# Patient Record
Sex: Female | Born: 1950
Health system: Southern US, Community
[De-identification: ages and names within clinical notes are randomized; demographics above are authoritative.]

## PROBLEM LIST (undated history)

## (undated) ENCOUNTER — Emergency Department (HOSPITAL_BASED_OUTPATIENT_CLINIC_OR_DEPARTMENT_OTHER): Admission: EM | Payer: Medicare HMO

## (undated) DIAGNOSIS — I10 Essential (primary) hypertension: Secondary | ICD-10-CM

## (undated) DIAGNOSIS — R42 Dizziness and giddiness: Secondary | ICD-10-CM

## (undated) DIAGNOSIS — IMO0002 Reserved for concepts with insufficient information to code with codable children: Secondary | ICD-10-CM

## (undated) DIAGNOSIS — K589 Irritable bowel syndrome without diarrhea: Secondary | ICD-10-CM

## (undated) DIAGNOSIS — Z780 Asymptomatic menopausal state: Secondary | ICD-10-CM

## (undated) HISTORY — DX: Essential (primary) hypertension: I10

## (undated) HISTORY — DX: Irritable bowel syndrome, unspecified: K58.9

## (undated) HISTORY — DX: Dizziness and giddiness: R42

## (undated) HISTORY — DX: Asymptomatic menopausal state: Z78.0

## (undated) HISTORY — DX: Reserved for concepts with insufficient information to code with codable children: IMO0002

---

## 1998-06-08 ENCOUNTER — Encounter: Payer: Self-pay | Admitting: *Deleted

## 1998-06-08 ENCOUNTER — Ambulatory Visit (HOSPITAL_COMMUNITY): Admission: RE | Admit: 1998-06-08 | Discharge: 1998-06-08 | Payer: Self-pay | Admitting: *Deleted

## 1998-06-10 ENCOUNTER — Emergency Department (HOSPITAL_COMMUNITY): Admission: EM | Admit: 1998-06-10 | Discharge: 1998-06-10 | Payer: Self-pay | Admitting: Emergency Medicine

## 1999-01-21 ENCOUNTER — Encounter: Payer: Self-pay | Admitting: *Deleted

## 1999-01-21 ENCOUNTER — Ambulatory Visit (HOSPITAL_COMMUNITY): Admission: RE | Admit: 1999-01-21 | Discharge: 1999-01-21 | Payer: Self-pay | Admitting: *Deleted

## 1999-07-14 ENCOUNTER — Ambulatory Visit (HOSPITAL_COMMUNITY): Admission: RE | Admit: 1999-07-14 | Discharge: 1999-07-14 | Payer: Self-pay | Admitting: *Deleted

## 1999-07-14 ENCOUNTER — Encounter: Payer: Self-pay | Admitting: *Deleted

## 2000-08-01 ENCOUNTER — Encounter: Payer: Self-pay | Admitting: *Deleted

## 2000-08-01 ENCOUNTER — Ambulatory Visit (HOSPITAL_COMMUNITY): Admission: RE | Admit: 2000-08-01 | Discharge: 2000-08-01 | Payer: Self-pay | Admitting: *Deleted

## 2000-11-15 ENCOUNTER — Ambulatory Visit (HOSPITAL_COMMUNITY): Admission: RE | Admit: 2000-11-15 | Discharge: 2000-11-15 | Payer: Self-pay | Admitting: Surgery

## 2002-08-11 ENCOUNTER — Other Ambulatory Visit: Admission: RE | Admit: 2002-08-11 | Discharge: 2002-08-11 | Payer: Self-pay | Admitting: Obstetrics and Gynecology

## 2002-09-17 ENCOUNTER — Ambulatory Visit (HOSPITAL_COMMUNITY): Admission: RE | Admit: 2002-09-17 | Discharge: 2002-09-17 | Payer: Self-pay | Admitting: Obstetrics and Gynecology

## 2002-09-17 HISTORY — PX: LEEP: SHX91

## 2003-03-02 ENCOUNTER — Other Ambulatory Visit: Admission: RE | Admit: 2003-03-02 | Discharge: 2003-03-02 | Payer: Self-pay | Admitting: Obstetrics and Gynecology

## 2003-06-22 ENCOUNTER — Other Ambulatory Visit: Admission: RE | Admit: 2003-06-22 | Discharge: 2003-06-22 | Payer: Self-pay | Admitting: Obstetrics and Gynecology

## 2003-12-02 ENCOUNTER — Other Ambulatory Visit: Admission: RE | Admit: 2003-12-02 | Discharge: 2003-12-02 | Payer: Self-pay | Admitting: Obstetrics and Gynecology

## 2003-12-30 ENCOUNTER — Ambulatory Visit: Payer: Self-pay | Admitting: Gastroenterology

## 2004-01-01 ENCOUNTER — Ambulatory Visit: Payer: Self-pay | Admitting: Gastroenterology

## 2004-01-20 ENCOUNTER — Ambulatory Visit: Payer: Self-pay | Admitting: Gastroenterology

## 2004-04-05 ENCOUNTER — Ambulatory Visit: Payer: Self-pay | Admitting: Gastroenterology

## 2004-09-08 DIAGNOSIS — R87619 Unspecified abnormal cytological findings in specimens from cervix uteri: Secondary | ICD-10-CM

## 2004-09-08 DIAGNOSIS — IMO0002 Reserved for concepts with insufficient information to code with codable children: Secondary | ICD-10-CM

## 2004-09-08 HISTORY — DX: Unspecified abnormal cytological findings in specimens from cervix uteri: R87.619

## 2004-09-08 HISTORY — DX: Reserved for concepts with insufficient information to code with codable children: IMO0002

## 2004-09-26 ENCOUNTER — Other Ambulatory Visit: Admission: RE | Admit: 2004-09-26 | Discharge: 2004-09-26 | Payer: Self-pay | Admitting: Obstetrics and Gynecology

## 2005-04-12 ENCOUNTER — Other Ambulatory Visit: Admission: RE | Admit: 2005-04-12 | Discharge: 2005-04-12 | Payer: Self-pay | Admitting: Obstetrics and Gynecology

## 2006-04-08 ENCOUNTER — Emergency Department (HOSPITAL_COMMUNITY): Admission: EM | Admit: 2006-04-08 | Discharge: 2006-04-08 | Payer: Self-pay | Admitting: Emergency Medicine

## 2006-05-07 ENCOUNTER — Encounter: Admission: RE | Admit: 2006-05-07 | Discharge: 2006-05-07 | Payer: Self-pay | Admitting: Obstetrics and Gynecology

## 2010-03-20 ENCOUNTER — Encounter: Payer: Self-pay | Admitting: Obstetrics and Gynecology

## 2010-04-13 ENCOUNTER — Other Ambulatory Visit: Payer: Self-pay | Admitting: Gastroenterology

## 2010-04-15 ENCOUNTER — Ambulatory Visit
Admission: RE | Admit: 2010-04-15 | Discharge: 2010-04-15 | Disposition: A | Discharge status: Home / Self Care | Payer: BC Managed Care – PPO | Source: Ambulatory Visit | Attending: Gastroenterology | Admitting: Gastroenterology

## 2010-04-21 ENCOUNTER — Other Ambulatory Visit: Payer: Self-pay | Admitting: Gastroenterology

## 2010-04-21 ENCOUNTER — Other Ambulatory Visit: Payer: BC Managed Care – PPO

## 2010-04-21 ENCOUNTER — Ambulatory Visit
Admission: RE | Admit: 2010-04-21 | Discharge: 2010-04-21 | Disposition: A | Discharge status: Home / Self Care | Payer: BC Managed Care – PPO | Source: Ambulatory Visit | Attending: Gastroenterology | Admitting: Gastroenterology

## 2010-04-21 ENCOUNTER — Ambulatory Visit: Admission: RE | Admit: 2010-04-21 | Payer: BC Managed Care – PPO | Source: Ambulatory Visit

## 2010-04-21 DIAGNOSIS — R1011 Right upper quadrant pain: Secondary | ICD-10-CM

## 2010-04-21 MED ORDER — IOHEXOL 300 MG/ML  SOLN
100.0000 mL | Freq: Once | INTRAMUSCULAR | Status: AC | PRN
  Administered 2010-04-21: 100 mL via INTRAVENOUS

## 2011-03-17 ENCOUNTER — Ambulatory Visit (INDEPENDENT_AMBULATORY_CARE_PROVIDER_SITE_OTHER): Payer: BC Managed Care – PPO

## 2011-03-17 DIAGNOSIS — B9789 Other viral agents as the cause of diseases classified elsewhere: Secondary | ICD-10-CM

## 2011-05-08 ENCOUNTER — Ambulatory Visit (INDEPENDENT_AMBULATORY_CARE_PROVIDER_SITE_OTHER): Payer: BC Managed Care – PPO | Admitting: Physician Assistant

## 2011-05-08 VITALS — BP 116/69 | HR 75 | Temp 98.5°F | Resp 16 | Ht 67.5 in | Wt 214.0 lb

## 2011-05-08 DIAGNOSIS — H6690 Otitis media, unspecified, unspecified ear: Secondary | ICD-10-CM

## 2011-05-08 DIAGNOSIS — R059 Cough, unspecified: Secondary | ICD-10-CM

## 2011-05-08 DIAGNOSIS — R05 Cough: Secondary | ICD-10-CM

## 2011-05-08 DIAGNOSIS — I10 Essential (primary) hypertension: Secondary | ICD-10-CM | POA: Insufficient documentation

## 2011-05-08 DIAGNOSIS — J309 Allergic rhinitis, unspecified: Secondary | ICD-10-CM | POA: Insufficient documentation

## 2011-05-08 DIAGNOSIS — R42 Dizziness and giddiness: Secondary | ICD-10-CM

## 2011-05-08 MED ORDER — PROMETHAZINE-DM 6.25-15 MG/5ML PO SYRP: 5.0000 mL | ORAL_SOLUTION | Freq: Four times a day (QID) | ORAL | Status: AC | PRN

## 2011-05-08 MED ORDER — FLUTICASONE PROPIONATE 50 MCG/ACT NA SUSP: 2.0000 | Freq: Every day | NASAL | Status: DC

## 2011-05-08 MED ORDER — MECLIZINE HCL 25 MG PO TABS: 25.0000 mg | ORAL_TABLET | Freq: Three times a day (TID) | ORAL | Status: AC | PRN

## 2011-05-08 MED ORDER — CEFDINIR 300 MG PO CAPS: 300.0000 mg | ORAL_CAPSULE | Freq: Two times a day (BID) | ORAL | Status: AC

## 2011-05-08 NOTE — Progress Notes (Signed)
Patient ID: Alejandra Hebert MRN: [id number], DOB: 1950-03-02, 61 y.o. Date of Encounter: 05/08/2011, 7:23 PM  Primary Physician: No primary provider on file.  Chief Complaint:  Chief Complaint  Patient presents with  . URI    Started last Monday  . Otalgia    Right ear; Making pt dizzy    HPI: 61 y.o. year old female presents with 8 day history of nasal congestion, post nasal drip, sore throat, sinus pressure, and cough. Afebrile. No chills. Nasal congestion thick and clear. Cough is non-productive and not associated with time of day. Ears feel full, leading to sensation of muffled hearing, right greater than left. She states she has dealt with this problem her entire life. Most times when she gets a URI she develpos vertigo and "the room spins." Requests refill of Antivert.  Has tried OTC cold preps without success. No GI complaints. Appetite normal. Several sick contacts at work. No recent antibiotics, or recent travels.   She also has history moderate allergic rhinitis. Has previously used nasal sprays with good results, she just ran out and never got them refilled.   No leg trauma, sedentary periods, h/o cancer, or tobacco use.  Past Medical History  Diagnosis Date  . Vertigo   . Allergic rhinitis   . HTN (hypertension)      Home Meds: Prior to Admission medications   Medication Sig Start Date End Date Taking? Authorizing Provider  losartan (COZAAR) 25 MG tablet Take 25 mg by mouth daily.   Yes Historical Provider, MD    Allergies:  Allergies  Allergen Reactions  . Other Nausea Only    Etodalac  . Sulfa Antibiotics     History   Social History  . Marital Status: Divorced    Spouse Name: N/A    Number of Children: N/A  . Years of Education: N/A   Occupational History  . Not on file.   Social History Main Topics  . Smoking status: Former Smoker    Quit date: 03/31/2011  . Smokeless tobacco: Not on file  . Alcohol Use: Not on file  . Drug Use: Not on  file  . Sexually Active: Not on file   Other Topics Concern  . Not on file   Social History Narrative  . No narrative on file     Review of Systems: Constitutional: negative for chills, fever, night sweats or weight changes Cardiovascular: negative for chest pain or palpitations Respiratory: negative for hemoptysis, wheezing, or shortness of breath Abdominal: negative for abdominal pain, nausea, vomiting or diarrhea Dermatological: negative for rash Neurologic: negative for headache or dizziness   Physical Exam: Blood pressure 116/69, pulse 75, temperature 98.5 F (36.9 C), resp. rate 16, height 5' 7.5" (1.715 m), weight 214 lb (97.07 kg)., Body mass index is 33.02 kg/(m^2). General: Well developed, well nourished, in no acute distress. Head: Normocephalic, atraumatic, eyes without discharge, sclera non-icteric, nares are congested. Bilateral auditory canals clear, Right TM erythematous, dull, bulging, and with purulent effusion behind. No perforation. LeftTM is without perforation, pearly grey with reflective cone of light. No sinus TTP. Oral cavity moist, dentition normal. Posterior pharynx with post nasal drip and mild erythema. No peritonsillar abscess or tonsillar exudate. Neck: Supple. No thyromegaly. Full ROM. No lymphadenopathy. Lungs: Clear bilaterally to auscultation without wheezes, rales, or rhonchi. Breathing is unlabored.  Heart: RRR with S1 S2. No murmurs, rubs, or gallops appreciated. Msk:  Strength and tone normal for age. Extremities: No clubbing or cyanosis. No edema.  Neuro: Alert and oriented X 3. Moves all extremities spontaneously. CNII-XII grossly in tact. Psych:  Responds to questions appropriately with a normal affect.     ASSESSMENT AND PLAN:  61 y.o. year old female with AOM, vertigo, and allergic rhinitis with cough 1. AOM -Omnicef 300 mg 1 po bid #20 no RF  -Flonase 2 sprays each nare daily #1 6 RF -Mucinex -Tylenol/Motrin prn -Rest/fluids -RTC  precautions -RTC 3-5 days if no improvement  2. Vertigo -Antivert 25 mg #30 1 po tid prn vertigo RF 6 -Flonase per above  3. Allergic rhinitis with cough -Phenergan DM #4oz 1 tsp po q 4-6 hours prn cough no RF -Flonase per above  Signed, Eula Listen, PA-C 05/08/2011 7:23 PM

## 2011-05-20 ENCOUNTER — Ambulatory Visit (INDEPENDENT_AMBULATORY_CARE_PROVIDER_SITE_OTHER): Payer: BC Managed Care – PPO | Admitting: Family Medicine

## 2011-05-20 VITALS — BP 123/79 | HR 66 | Temp 98.5°F | Resp 16 | Ht 66.25 in | Wt 215.0 lb

## 2011-05-20 DIAGNOSIS — M62838 Other muscle spasm: Secondary | ICD-10-CM

## 2011-05-20 DIAGNOSIS — M549 Dorsalgia, unspecified: Secondary | ICD-10-CM

## 2011-05-20 NOTE — Progress Notes (Signed)
[facility]:  Office Visit  Chief Complaint:  Chief Complaint  Patient presents with  . Back Pain    mid-back  . Gas    HPI: Alejandra Hebert is a 61 y.o. female who complains of  Intermittent mid back pain x 2 weeks, sharp burning pain. Has not tried anything for it. This is actually a chronic problem, Tension/stress causes msk to tense and then gets worse. Has had xrays at chiropractors showing DJD. 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 She has IBS and cannot take a lot of things.   Past Medical History  Diagnosis Date  . Vertigo   . Allergic rhinitis   . HTN (hypertension)   . Irritable bowel syndrome    History reviewed. No pertinent past surgical history. History   Social History  . Marital Status: Divorced    Spouse Name: N/A    Number of Children: N/A  . Years of Education: N/A   Social History Main Topics  . Smoking status: Former Smoker    Quit date: 03/31/2011  . Smokeless tobacco: None  . Alcohol Use: None  . Drug Use: None  . Sexually Active: None   Other Topics Concern  . None   Social History Narrative  . None   No family history on file. Allergies  Allergen Reactions  . Other Nausea Only    Etodalac  . Sulfa Antibiotics    Prior to Admission medications   Medication Sig Start Date End Date Taking? Authorizing Provider  losartan (COZAAR) 25 MG tablet Take 25 mg by mouth daily.   Yes Historical Provider, MD  fluticasone (FLONASE) 50 MCG/ACT nasal spray Place 2 sprays into the nose daily. 05/08/11 05/07/12  Raymon Mutton Dunn, PA-C     ROS: The patient denies fevers, chills, night sweats, unintentional weight loss, chest pain, palpitations, wheezing, dyspnea on exertion, nausea, vomiting, abdominal pain, dysuria, hematuria, melena, numbness, weakness, or tingling. + back pain  All other systems have been reviewed and were otherwise  negative with the exception of those mentioned in the HPI and as above.    PHYSICAL EXAM: Filed Vitals:   05/20/11 1639  BP: 123/79  Pulse: 66  Temp: 98.5 F (36.9 C)  Resp: 16   Filed Vitals:   05/20/11 1639  Height: 5' 6.25" (1.683 m)  Weight: 215 lb (97.523 kg)   Body mass index is 34.44 kg/(m^2).  General: Alert, no acute distress HEENT:  Normocephalic, atraumatic, oropharynx patent.  Cardiovascular:  Regular rate and rhythm, no rubs murmurs or gallops.  No Carotid bruits, radial pulse intact. No pedal edema.  Respiratory: Clear to auscultation bilaterally.  No wheezes, rales, or rhonchi.  No cyanosis, no use of accessory musculature GI: No organomegaly, abdomen is soft and non-tender, positive bowel sounds.  No masses. Skin: No rashes. Neurologic: Facial musculature symmetric. Psychiatric: Patient is appropriate throughout our interaction. Lymphatic: No cervical lymphadenopathy Musculoskeletal: Gait intact. Neck: nl ROM, no paraspinal msk tenderness Thoracic: T paraspinal  msk tenderness,  no warmth/erythema, no scapular winging, full ROM. Patient feels better when she opens her chest up and moves her rhomboids and brings her shoulders back   LABS: No results found for this or any previous visit.   EKG/XRAY:   Primary read interpreted by Dr[facility] Conley Roll[facility] at [facility].   ASSESSMENT/PLAN: Encounter Diagnoses  Name  Primary?  . Muscle spasm Yes  . Back pain     Since she has IBS and a "sensitive stomach" I will rx lidocaine patch and see how she does. Advise to do back strengthening exercises and proper ergonomics.  If no improvement then consider xray F/u in 1 month   Buel Molder PHUONG, DO 05/22/2011 4:26 PM

## 2011-05-21 ENCOUNTER — Telehealth: Payer: Self-pay | Admitting: *Deleted

## 2011-05-21 MED ORDER — LIDOCAINE 5 % EX PTCH: 6.0000 | MEDICATED_PATCH | CUTANEOUS | Status: AC

## 2011-05-21 NOTE — Telephone Encounter (Signed)
PER DR LE PT IS TO USE 1/2 TO 1 PATCH ON NECK.  #30.  SPOKE WITH PHARMACIST ANGELA LMOM ADVISING PT TO PICKUP RX.

## 2011-06-06 ENCOUNTER — Emergency Department (HOSPITAL_COMMUNITY)
Admission: EM | Admit: 2011-06-06 | Discharge: 2011-06-06 | Discharge status: Against Medical Advice | Payer: BC Managed Care – PPO

## 2011-06-07 ENCOUNTER — Ambulatory Visit: Payer: Self-pay | Admitting: Family Medicine

## 2011-06-07 VITALS — BP 136/82 | HR 75 | Temp 98.0°F | Resp 18 | Ht 69.0 in | Wt 213.2 lb

## 2011-06-07 DIAGNOSIS — I10 Essential (primary) hypertension: Secondary | ICD-10-CM

## 2011-06-07 DIAGNOSIS — M79609 Pain in unspecified limb: Secondary | ICD-10-CM

## 2011-06-07 DIAGNOSIS — M79672 Pain in left foot: Secondary | ICD-10-CM

## 2011-06-07 DIAGNOSIS — S91115A Laceration without foreign body of left lesser toe(s) without damage to nail, initial encounter: Secondary | ICD-10-CM

## 2011-06-07 DIAGNOSIS — S91309A Unspecified open wound, unspecified foot, initial encounter: Secondary | ICD-10-CM

## 2011-06-07 NOTE — Progress Notes (Signed)
  Subjective:    Patient ID: Alejandra Hebert, female    DOB: 05-04-1950, 61 y.o.   MRN: [id number]  Toe Pain    Patient presents after tripping over garden fence about  Injured (L) foot; bruised first toe and cut plantar surface of (L) foot  Last Tdap > 10 years  PMH/ HTN   Review of Systems     Objective:   Physical Exam  Constitutional: She appears well-developed.  Cardiovascular: Normal rate, regular rhythm and normal heart sounds.   Pulmonary/Chest: Effort normal and breath sounds normal.  Musculoskeletal:       Feet:  Skin: Skin is warm. Ecchymosis (L) first toe and laceration (.5 cm superficial laceration plantar surface of (L) fifth toe ) noted.          Assessment & Plan:   1. Foot pain, left   2. Laceration of fifth toe, left; superficial   3. HTN (hypertension)    Wound cleansed and strips strips applied Patient declined x ray of (L) first toe Patient to receive Tdap at [facility] as currently she is without health insurance Reviewed S/S of secondary infection RTC if symptoms persist or worsen

## 2011-08-03 ENCOUNTER — Ambulatory Visit (INDEPENDENT_AMBULATORY_CARE_PROVIDER_SITE_OTHER): Payer: 59 | Admitting: Family Medicine

## 2011-08-03 ENCOUNTER — Encounter: Payer: Self-pay | Admitting: Family Medicine

## 2011-08-03 VITALS — BP 128/74 | HR 69 | Temp 98.1°F | Resp 16 | Ht 66.5 in | Wt 220.2 lb

## 2011-08-03 DIAGNOSIS — H6120 Impacted cerumen, unspecified ear: Secondary | ICD-10-CM

## 2011-08-03 DIAGNOSIS — H6090 Unspecified otitis externa, unspecified ear: Secondary | ICD-10-CM

## 2011-08-03 DIAGNOSIS — H60399 Other infective otitis externa, unspecified ear: Secondary | ICD-10-CM

## 2011-08-03 MED ORDER — NEOMYCIN-POLYMYXIN-HC 3.5-10000-1 OT SOLN: 3.0000 [drp] | Freq: Four times a day (QID) | OTIC | Status: AC

## 2011-08-03 NOTE — Progress Notes (Signed)
  Subjective:    Patient ID: Alejandra Hebert, female    DOB: [birth year], 61 y.o.   MRN: [id number]  HPI Cerumen impaction.  This has been a recurrent issue.  Has had some difficulty in hearing associated with this.  No headaches, dizziness.  No fevers.    Review of Systems See HPI, otherwise ROS negative     Objective:   Physical Exam Gen: up in chair, NAD HEENT: NCAT, EOMI, bilateral cerumen impaction, L tenderness to otoscopic evaluation  CV: RRR, no murmurs auscultated PULM: CTAB, no wheezes, rales, rhoncii ABD: S/NT/+ bowel sounds  EXT: 2+ peripheral pulses    Assessment & Plan:  Cerumen Impaction:  Manually removed at bedside.  Discussed general ear cleaning guidelines  Handout given.  Follow up as needed.   L ear otitis externa- cortisporin otic.     The patient and/or caregiver has been counseled thoroughly with regard to treatment plan and/or medications prescribed including dosage, schedule, interactions, rationale for use, and possible side effects and they verbalize understanding. Diagnoses and expected course of recovery discussed and will return if not improved as expected or if the condition worsens. Patient and/or caregiver verbalized understanding.

## 2011-08-03 NOTE — Patient Instructions (Signed)
Cerumen Impaction  A cerumen impaction is when the wax in your ear forms a plug. This plug usually causes reduced hearing. Sometimes it also causes an earache or dizziness. Removing a cerumen impaction can be difficult and painful. The wax sticks to the ear canal. The canal is sensitive and bleeds easily. If you try to remove a heavy wax buildup with a cotton tipped swab, you may push it in further.  Irrigation with water, suction, and small ear curettes may be used to clear out the wax. If the impaction is fixed to the skin in the ear canal, ear drops may be needed for a few days to loosen the wax. People who build up a lot of wax frequently can use ear wax removal products available in your local drugstore.  SEEK MEDICAL CARE IF:   You develop an earache, increased hearing loss, or marked dizziness.  Document Released: 03/23/2004 Document Revised: 02/02/2011 Document Reviewed: 05/13/2009  [employer] Patient Information 2012 [employer].    Otitis Externa  Otitis externa ("swimmer's ear") is a germ (bacterial) or fungal infection of the outer ear canal (from the eardrum to the outside of the ear). Swimming in dirty water may cause swimmer's ear. It also may be caused by moisture in the ear from water remaining after swimming or bathing. Often the first signs of infection may be itching in the ear canal. This may progress to ear canal swelling, redness, and pus drainage, which may be signs of infection.  HOME CARE INSTRUCTIONS    Apply the antibiotic drops to the ear canal as prescribed by your doctor.   This can be a very painful medical condition. A strong pain reliever may be prescribed.   Only take over-the-counter or prescription medicines for pain, discomfort, or fever as directed by your caregiver.   If your caregiver has given you a follow-up appointment, it is very important to keep that appointment. Not keeping the appointment could result in a chronic or permanent injury, pain, hearing loss and  disability. If there is any problem keeping the appointment, you must call back to this facility for assistance.  PREVENTION    It is important to keep your ear dry. Use the corner of a towel to wick water out of the ear canal after swimming or bathing.   Avoid scratching in your ear. This can damage the ear canal or remove the protective wax lining the canal and make it easier for germs (bacteria) or a fungus to grow.   You may use ear drops made of rubbing alcohol and vinegar after swimming to prevent future "swimmer's ear" infections. Make up a small bottle of equal parts white vinegar and alcohol. Put 3 or 4 drops into each ear after swimming.   Avoid swimming in lakes, polluted water, or poorly chlorinated pools.  SEEK MEDICAL CARE IF:    An oral temperature above 102 F (38.9 C) develops.   Your ear is still painful after 3 days and shows signs of getting worse (redness, swelling, pain, or pus).  MAKE SURE YOU:    Understand these instructions.   Will watch your condition.   Will get help right away if you are not doing well or get worse.  Document Released: 02/13/2005 Document Revised: 02/02/2011 Document Reviewed: 09/20/2007  [employer] Patient Information 2012 [employer]

## 2011-08-05 NOTE — Progress Notes (Signed)
Note reviewed and agree with plan

## 2011-09-18 ENCOUNTER — Encounter: Payer: Self-pay | Admitting: Obstetrics and Gynecology

## 2011-09-21 ENCOUNTER — Ambulatory Visit (INDEPENDENT_AMBULATORY_CARE_PROVIDER_SITE_OTHER): Payer: 59 | Admitting: Obstetrics and Gynecology

## 2011-09-21 ENCOUNTER — Encounter: Payer: Self-pay | Admitting: Obstetrics and Gynecology

## 2011-09-21 VITALS — Ht 66.5 in | Wt 221.0 lb

## 2011-09-21 DIAGNOSIS — Z124 Encounter for screening for malignant neoplasm of cervix: Secondary | ICD-10-CM

## 2011-09-21 DIAGNOSIS — IMO0002 Reserved for concepts with insufficient information to code with codable children: Secondary | ICD-10-CM

## 2011-09-21 DIAGNOSIS — R87613 High grade squamous intraepithelial lesion on cytologic smear of cervix (HGSIL): Secondary | ICD-10-CM

## 2011-09-21 NOTE — Progress Notes (Signed)
Pt voices no complaints today.   Last Pap: 06/23/2010 WNL: Yes Regular Periods:no Contraception: none  Monthly Breast exam:yes Tetanus<33yrs:yes ? Nl.Bladder Function:yes Daily BMs:yes Healthy Diet:yes Calcium:no Mammogram:yes Date of Mammogram: 02/23/2011 @ [facility] Exercise:yes Have often Exercise: walk Seatbelt: yes Abuse at home: no Stressful work:no Sigmoid-colonoscopy: 2009-wnl per pt Dr.Hayes Bone Density: No PCP: Dr. Nicholos Johns Change in PMH: no change Change in FMH:no change BMI=35 Physical Examination: Neck - supple, no significant adenopathy Chest - clear to auscultation, no wheezes, rales or rhonchi, symmetric air entry Heart - normal rate, regular rhythm, normal S1, S2, no murmurs, rubs, clicks or gallops Abdomen - soft, nontender, nondistended, no masses or organomegaly Breasts - breasts appear normal, no suspicious masses, no skin or nipple changes or axillary nodes Pelvic - normal external genitalia, vulva, vagina, cervix, uterus and adnexa, atrophic Rectal - deferred per pt Musculoskeletal - no joint tenderness, deformity or swelling Extremities - peripheral pulses normal, no pedal edema, no clubbing or cyanosis Skin - normal coloration and turgor, no rashes, no suspicious skin lesions noted Normal AEX Pt due for mammogram 12/13 colonoscopy due no Pap done yes h/o HGSIL RT one year Diet and exercise discussed

## 2011-09-22 ENCOUNTER — Telehealth: Payer: Self-pay | Admitting: Obstetrics and Gynecology

## 2011-09-22 LAB — PAP IG W/ RFLX HPV ASCU

## 2011-09-22 NOTE — Telephone Encounter (Signed)
NICCOLE/ND pt

## 2011-09-25 NOTE — Telephone Encounter (Signed)
Lm on vm tcb rgd msg 

## 2011-09-28 NOTE — Telephone Encounter (Signed)
Lm on vm tcb rgd msg 

## 2011-10-03 NOTE — Telephone Encounter (Signed)
Spoke with pt rgd msg pt states she needs a vaginal lubricant states ND wrote her for something but cant remember the name advised pt will consult with ND and call her back pt voice understanding

## 2011-10-04 NOTE — Telephone Encounter (Signed)
Pt may use astroglide or ky jelly

## 2011-10-04 NOTE — Telephone Encounter (Signed)
Lm on vm tcb rgd prevous call

## 2011-10-09 NOTE — Telephone Encounter (Signed)
Lm on vm tcb rgd previous call 

## 2011-10-11 NOTE — Telephone Encounter (Signed)
Lm on vm tcb rgd previous msg 

## 2013-04-16 ENCOUNTER — Ambulatory Visit (INDEPENDENT_AMBULATORY_CARE_PROVIDER_SITE_OTHER): Payer: BC Managed Care – PPO | Admitting: Family Medicine

## 2013-04-16 ENCOUNTER — Ambulatory Visit: Payer: BC Managed Care – PPO

## 2013-04-16 VITALS — BP 122/68 | HR 60 | Temp 98.3°F | Resp 16 | Ht 66.5 in | Wt 215.0 lb

## 2013-04-16 DIAGNOSIS — M25562 Pain in left knee: Secondary | ICD-10-CM

## 2013-04-16 DIAGNOSIS — M25569 Pain in unspecified knee: Secondary | ICD-10-CM

## 2013-04-16 DIAGNOSIS — M7050 Other bursitis of knee, unspecified knee: Secondary | ICD-10-CM

## 2013-04-16 MED ORDER — DICLOFENAC SODIUM 75 MG PO TBEC: 75.0000 mg | DELAYED_RELEASE_TABLET | Freq: Two times a day (BID) | ORAL | Status: DC

## 2013-04-16 NOTE — Progress Notes (Signed)
Subjective: Patient's left knee is hurting her a lot today. She took some ibuprofen and improved a little bit. She shoveled snow yesterday and that's what flared up. She's had chronic pain in her right knee. She knows she needs to lose weight, and is working at it.  Objective: Medial aspect of the left knee just below the joint line is very tender. Not much pain on flexion extension of much more pain on touching the adduction. No crepitance. There is a little crepitance of the right knee.  Assessment: Knee pain Pes anserine bursitis obesity  Plan: X-ray of the knee was ordered. However it is still pending and the patient feels the need to go on home and says she will come back if worse.  Diclofenac and ice   Patient needed to take her husband to work and could not wait for the x-rays or was canceled.

## 2013-04-16 NOTE — Patient Instructions (Signed)
Take diclofenac 75 one twice daily for knee  Ice  Return if not improving over next 10 days.

## 2013-06-04 ENCOUNTER — Ambulatory Visit (INDEPENDENT_AMBULATORY_CARE_PROVIDER_SITE_OTHER): Payer: BC Managed Care – PPO

## 2013-06-05 ENCOUNTER — Ambulatory Visit (INDEPENDENT_AMBULATORY_CARE_PROVIDER_SITE_OTHER): Payer: BC Managed Care – PPO | Admitting: Family Medicine

## 2013-06-05 ENCOUNTER — Telehealth: Payer: Self-pay

## 2013-06-05 VITALS — BP 110/80 | HR 73 | Temp 98.5°F | Resp 16 | Ht 66.0 in | Wt 211.0 lb

## 2013-06-05 DIAGNOSIS — R21 Rash and other nonspecific skin eruption: Secondary | ICD-10-CM

## 2013-06-05 DIAGNOSIS — J309 Allergic rhinitis, unspecified: Secondary | ICD-10-CM

## 2013-06-05 MED ORDER — TRIAMCINOLONE ACETONIDE 0.1 % EX CREA: 1.0000 "application " | TOPICAL_CREAM | Freq: Two times a day (BID) | CUTANEOUS | Status: DC

## 2013-06-05 MED ORDER — FLUTICASONE PROPIONATE 50 MCG/ACT NA SUSP: 2.0000 | Freq: Every day | NASAL | Status: DC

## 2013-06-05 NOTE — Telephone Encounter (Signed)
Patient called stated she was told to buy Flonase over the counter, Patient stated it will be cheaper to call in a prescription for Flonase. Please call in at [facility] on [address].

## 2013-06-05 NOTE — Patient Instructions (Signed)
You have been diagnosed with allergic rhinitis.  Please purchase and use Nasacort spray as instructed on packaging.  Please purchase Claritin OTC and take 1 pill daily.  Use saline nasal rinse as needed.  Drink lots of water and get plenty of rest.    You have also been diagnosed with a viral infection.  Antibiotics do not help tx viral infections.   Please get plenty of rest and drink lots of fluids.  If you are not improving in the next 12 days please return to clinic.

## 2013-06-05 NOTE — Telephone Encounter (Signed)
Sent in rx for flonase for her- LMOM that this is done.

## 2013-06-05 NOTE — Progress Notes (Signed)
[facility] [address], [place] [address] [phone]  Date:  06/05/2013   Name:  Alejandra Hebert   DOB:  [birth year]   MRN:  [id number]  PCP:  Vena Austria, MD    Chief Complaint: Sinus Problem and Fatigue   History of Present Illness:  ASHAUNA BERTHOLF is a 63 y.o. very pleasant female patient who presents with the following: cough, fever, sneezing, and fatigue beginning on Sunday. States that symptoms have been getting worse.  Biggest complaint is anorexia.  Hasn't been able to eat due to loss of taste and smell sensation.  Pt did not receive flu vaccination.  Son had same complaints starting last week.  Has not taken temperature at home but states she had feverish symptoms Sunday through Tuesday.  Denies sinus surgery.  Cough is mostly dry with occasional phlegm.  Clear nasal discharge.   Pt also has rash on right arm that starts at her mid bicep and extends to wrist.  It is pruritic and has been present for months.  Tried hydrocortisone cream with relief.  Thinks it could be related to the cat.    Patient Active Problem List   Diagnosis Date Noted  . HGSIL (high grade squamous intraepithelial dysplasia) 09/21/2011  . Vertigo   . Allergic rhinitis   . HTN (hypertension)     Past Medical History  Diagnosis Date  . Vertigo   . Allergic rhinitis   . HTN (hypertension)   . Irritable bowel syndrome   . Menopause   . Abnormal Pap smear 09/08/2004    LGSIL/ colpo 10/18/2004    Past Surgical History  Procedure Laterality Date  . Leep  09/17/2002    History  Substance Use Topics  . Smoking status: Former Smoker    Quit date: 03/31/2011  . Smokeless tobacco: Never Used  . Alcohol Use: No    No family history on file.  Allergies  Allergen Reactions  . Latex Rash  . Erythromycin Nausea Only  . Other Nausea Only    Etodalac  . Sulfa Antibiotics Nausea Only    Medication list has been reviewed and updated.  Current Outpatient  Prescriptions on File Prior to Visit  Medication Sig Dispense Refill  . losartan (COZAAR) 25 MG tablet Take 25 mg by mouth daily.      . Multiple Vitamin (MULTIVITAMIN) tablet Take 1 tablet by mouth daily.      . Multiple Vitamins-Minerals (ANTIOXIDANT PO) Take 1 tablet by mouth daily.       No current facility-administered medications on file prior to visit.    Review of Systems: Consitutional: admits prior fever, denies night sweats GI- admits anorexia, denies diarrhea, constipation, nausea and vomiting GU- denies painful urination Cardio- denies chest pain, palpitations Resp- denies wheeze, admits cough,  MS- admits muscle aches  Physical Examination: Filed Vitals:   06/05/13 1015  BP: 102/68  Pulse: 73  Temp: 98.5 F (36.9 C)  Resp: 16   Filed Vitals:   06/05/13 1015  Height: 5\' 6"  (1.676 m)  Weight: 211 lb (95.709 kg)   Body mass index is 34.07 kg/(m^2). Ideal Body Weight: Weight in (lb) to have BMI = 25: 154.6  GEN: WDWN, NAD, Non-toxic, A & O x 3, overweight, looks well HEENT: Atraumatic, Normocephalic. Neck supple. No masses, No LAD. Mild erythema in posterior pharynx no exudates.   Ears and Nose: No external deformity. Bilateral  ear canal erythematous with increased vascularization.  Non bulging tympanic  membrane. Swollen nasal turbinates.  CV: RRR, No M/G/R. No JVD. No thrill. No extra heart sounds. PULM: CTA B, no wheezes, crackles, rhonchi. No retractions. No resp. distress. No accessory muscle use. EXTR: on rt arm from mid humerus to wrist that is raised, pruritic and dry.  PSYCH: Normally interactive. Conversant. Not depressed or anxious appearing.    Assessment and Plan: Rash and nonspecific skin eruption Viral illness - increase fluid intake and rest Allergic Rhinitis - Nasacort, saline rinses, loratadine OTC     Signed Lamar Blinks, MD

## 2013-12-21 ENCOUNTER — Ambulatory Visit (INDEPENDENT_AMBULATORY_CARE_PROVIDER_SITE_OTHER): Payer: BC Managed Care – PPO | Admitting: Family Medicine

## 2013-12-21 VITALS — BP 120/70 | HR 83 | Temp 98.6°F | Resp 16 | Ht 66.5 in | Wt 198.2 lb

## 2013-12-21 DIAGNOSIS — K589 Irritable bowel syndrome without diarrhea: Secondary | ICD-10-CM

## 2013-12-21 MED ORDER — LUBIPROSTONE 8 MCG PO CAPS: 8.0000 ug | ORAL_CAPSULE | Freq: Two times a day (BID) | ORAL | Status: DC

## 2013-12-21 NOTE — Patient Instructions (Signed)
Contact Dr. Amedeo Plenty about the colonoscopy Before you take the new medicine, try probiotics with each meal for day or so.   Irritable Bowel Syndrome Irritable bowel syndrome (IBS) is caused by a disturbance of normal bowel function and is a common digestive disorder. You may also hear this condition called spastic colon, mucous colitis, and irritable colon. There is no cure for IBS. However, symptoms often gradually improve or disappear with a good diet, stress management, and medicine. This condition usually appears in late adolescence or early adulthood. Women develop it twice as often as men. CAUSES  After food has been digested and absorbed in the small intestine, waste material is moved into the large intestine, or colon. In the colon, water and salts are absorbed from the undigested products coming from the small intestine. The remaining residue, or fecal material, is held for elimination. Under normal circumstances, gentle, rhythmic contractions of the bowel walls push the fecal material along the colon toward the rectum. In IBS, however, these contractions are irregular and poorly coordinated. The fecal material is either retained too long, resulting in constipation, or expelled too soon, producing diarrhea. SIGNS AND SYMPTOMS  The most common symptom of IBS is abdominal pain. It is often in the lower left side of the abdomen, but it may occur anywhere in the abdomen. The pain comes from spasms of the bowel muscles happening too much and from the buildup of gas and fecal material in the colon. This pain:  Can range from sharp abdominal cramps to a dull, continuous ache.  Often worsens soon after eating.  Is often relieved by having a bowel movement or passing gas. Abdominal pain is usually accompanied by constipation, but it may also produce diarrhea. The diarrhea often occurs right after a meal or upon waking up in the morning. The stools are often soft, watery, and flecked with mucus. Other  symptoms of IBS include:  Bloating.  Loss of appetite.  Heartburn.  Backache.  Dull pain in the arms or shoulders.  Nausea.  Burping.  Vomiting.  Gas. IBS may also cause symptoms that are unrelated to the digestive system, such as:  Fatigue.  Headaches.  Anxiety.  Shortness of breath.  Trouble concentrating.  Dizziness. These symptoms tend to come and go. DIAGNOSIS  The symptoms of IBS may seem like symptoms of other, more serious digestive disorders. Your health care provider may want to perform tests to exclude these disorders.  TREATMENT Many medicines are available to help correct bowel function or relieve bowel spasms and abdominal pain. Among the medicines available are:  Laxatives for severe constipation and to help restore normal bowel habits.  Specific antidiarrheal medicines to treat severe or lasting diarrhea.  Antispasmodic agents to relieve intestinal cramps. Your health care provider may also decide to treat you with a mild tranquilizer or sedative during unusually stressful periods in your life. Your health care provider may also prescribe antidepressant medicine. The use of this medicine has been shown to reduce pain and other symptoms of IBS. Remember that if any medicine is prescribed for you, you should take it exactly as directed. Make sure your health care provider knows how well it worked for you. HOME CARE INSTRUCTIONS   Take all medicines as directed by your health care provider.  Avoid foods that are high in fat or oils, such as heavy cream, butter, frankfurters, sausage, and other fatty meats.  Avoid foods that make you go to the bathroom, such as fruit, fruit juice, and dairy  products.  Cut out carbonated drinks, chewing gum, and "gassy" foods such as beans and cabbage. This may help relieve bloating and burping.  Eat foods with bran, and drink plenty of liquids with the bran foods. This helps relieve constipation.  Keep track of what  foods seem to bring on your symptoms.  Avoid emotionally charged situations or circumstances that produce anxiety.  Start or continue exercising.  Get plenty of rest and sleep. Document Released: 02/13/2005 Document Revised: 02/18/2013 Document Reviewed: 10/04/2007 [facility] Patient Information 2015 [place], [place]. This information is not intended to replace advice given to you by your health care provider. Make sure you discuss any questions you have with your health care provider.

## 2013-12-21 NOTE — Progress Notes (Signed)
Subjective:  This chart was scribed for Robyn Haber, MD by Dellis Filbert, ED Scribe at [facility].The patient was seen in exam room 10 and the patient's care was started at 2:43 PM.   Patient ID: Alejandra Hebert, female    DOB: 1950/07/05, 63 y.o.   MRN: [id number]  HPI HPI Comments: Alejandra Hebert is a 63 y.o. female  with a history of IBS who presents to [facility] complaining of abdominal pain. She has constipation, bloating, belching, and gas as associated symptoms. She states because she has been stressed she took a xanax and it gives her constipation.She thinks the stress makes her more bloated. She drank a glass of prune juice and it provided mild relief. She took Activia last night and it provided her mild relief. She will be setting up an appointment with Dr. Llana Aliment a GI specialist. Pt notes she has been very stressed because her 3 cats have recently passed away and one new kitten she has recently gotten has a fatal disease. 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 She denies fever, nausea, vomiting.  Patient Active Problem List   Diagnosis Date Noted  . HGSIL (high grade squamous intraepithelial dysplasia) 09/21/2011  . Vertigo   . Allergic rhinitis   . HTN (hypertension)    Past Medical History  Diagnosis Date  . Vertigo   . Allergic rhinitis   . HTN (hypertension)   . Irritable bowel syndrome   . Menopause   . Abnormal Pap smear 09/08/2004    LGSIL/ colpo 10/18/2004   Past Surgical History  Procedure Laterality Date  . Leep  09/17/2002   Allergies  Allergen Reactions  . Latex Rash  . Erythromycin Nausea Only  . Other Nausea Only    Etodalac  . Sulfa Antibiotics Nausea Only   Prior to Admission medications   Medication Sig Start Date End Date Taking? Authorizing Provider  losartan (COZAAR) 25 MG tablet Take 25 mg by mouth daily.   Yes Historical Provider, MD  Multiple Vitamin (MULTIVITAMIN) tablet Take 1 tablet by  mouth daily.   Yes Historical Provider, MD  Multiple Vitamins-Minerals (ANTIOXIDANT PO) Take 1 tablet by mouth daily.   Yes Historical Provider, MD   History   Social History  . Marital Status: Divorced    Spouse Name: N/A    Number of Children: N/A  . Years of Education: N/A   Occupational History  . Not on file.   Social History Main Topics  . Smoking status: Former Smoker    Quit date: 03/31/2011  . Smokeless tobacco: Never Used  . Alcohol Use: No  . Drug Use: No  . Sexual Activity: Not on file     Comment: meno    Other Topics Concern  . Not on file   Social History Narrative  . No narrative on file   Review of Systems  Constitutional: Negative for fever.  Gastrointestinal: Positive for abdominal pain and constipation. Negative for nausea and vomiting.  Psychiatric/Behavioral: The patient is nervous/anxious.        Objective:   Physical Exam  Nursing note and vitals reviewed. Constitutional: She is oriented to person, place, and time. She appears well-developed and well-nourished. No distress.  HENT:  Head: Normocephalic and atraumatic.  Eyes: EOM are normal.  Neck: Normal range of motion.  Cardiovascular: Normal rate.   Pulmonary/Chest: Effort normal.  Abdominal: There is no tenderness. There is no rebound.  Hyperactive bowel sound.  Musculoskeletal: Normal range of motion.  Neurological: She is alert and oriented to person, place, and time.  Skin: Skin is warm and dry.  Psychiatric: She has a normal mood and affect. Her behavior is normal.    BP 120/70  Pulse 83  Temp(Src) 98.6 F (37 C) (Oral)  Resp 16  Ht 5' 6.5" (1.689 m)  Wt 198 lb 3.2 oz (89.903 kg)  BMI 31.51 kg/m2  SpO2 97%       Assessment & Plan:  I personally performed the services described in this documentation, which was scribed in my presence. The recorded information has been reviewed and is accurate.  IBS (irritable bowel syndrome) - Plan: lubiprostone (AMITIZA) 8 MCG  capsule Follow up with Dr. Amedeo Plenty Signed, Robyn Haber, MD

## 2014-07-02 ENCOUNTER — Other Ambulatory Visit: Payer: Self-pay

## 2014-07-02 DIAGNOSIS — Z1231 Encounter for screening mammogram for malignant neoplasm of breast: Secondary | ICD-10-CM

## 2014-07-16 ENCOUNTER — Ambulatory Visit
Admission: RE | Admit: 2014-07-16 | Discharge: 2014-07-16 | Disposition: A | Discharge status: Home / Self Care | Payer: PPO | Source: Ambulatory Visit

## 2014-07-16 DIAGNOSIS — Z1231 Encounter for screening mammogram for malignant neoplasm of breast: Secondary | ICD-10-CM

## 2014-07-17 ENCOUNTER — Other Ambulatory Visit: Payer: Self-pay | Admitting: Obstetrics and Gynecology

## 2014-07-17 DIAGNOSIS — R928 Other abnormal and inconclusive findings on diagnostic imaging of breast: Secondary | ICD-10-CM

## 2014-07-21 ENCOUNTER — Ambulatory Visit
Admission: RE | Admit: 2014-07-21 | Discharge: 2014-07-21 | Disposition: A | Discharge status: Home / Self Care | Payer: PPO | Source: Ambulatory Visit | Attending: Obstetrics and Gynecology | Admitting: Obstetrics and Gynecology

## 2014-07-21 DIAGNOSIS — R928 Other abnormal and inconclusive findings on diagnostic imaging of breast: Secondary | ICD-10-CM

## 2014-07-24 ENCOUNTER — Other Ambulatory Visit: Payer: PPO

## 2014-11-11 ENCOUNTER — Encounter (HOSPITAL_COMMUNITY): Payer: Self-pay | Admitting: Emergency Medicine

## 2014-11-11 ENCOUNTER — Emergency Department (HOSPITAL_COMMUNITY)
Admission: EM | Admit: 2014-11-11 | Discharge: 2014-11-12 | Disposition: A | Discharge status: Home / Self Care | Payer: PPO | Attending: Emergency Medicine | Admitting: Emergency Medicine

## 2014-11-11 DIAGNOSIS — Z91040 Latex allergy status: Secondary | ICD-10-CM | POA: Diagnosis not present

## 2014-11-11 DIAGNOSIS — Z23 Encounter for immunization: Secondary | ICD-10-CM | POA: Insufficient documentation

## 2014-11-11 DIAGNOSIS — S80811A Abrasion, right lower leg, initial encounter: Secondary | ICD-10-CM

## 2014-11-11 DIAGNOSIS — Z79899 Other long term (current) drug therapy: Secondary | ICD-10-CM | POA: Insufficient documentation

## 2014-11-11 DIAGNOSIS — Z720 Tobacco use: Secondary | ICD-10-CM | POA: Insufficient documentation

## 2014-11-11 DIAGNOSIS — Z8742 Personal history of other diseases of the female genital tract: Secondary | ICD-10-CM | POA: Insufficient documentation

## 2014-11-11 DIAGNOSIS — Y998 Other external cause status: Secondary | ICD-10-CM | POA: Insufficient documentation

## 2014-11-11 DIAGNOSIS — S8992XA Unspecified injury of left lower leg, initial encounter: Secondary | ICD-10-CM | POA: Diagnosis present

## 2014-11-11 DIAGNOSIS — Y9289 Other specified places as the place of occurrence of the external cause: Secondary | ICD-10-CM | POA: Diagnosis not present

## 2014-11-11 DIAGNOSIS — I10 Essential (primary) hypertension: Secondary | ICD-10-CM | POA: Diagnosis not present

## 2014-11-11 DIAGNOSIS — S80812A Abrasion, left lower leg, initial encounter: Secondary | ICD-10-CM

## 2014-11-11 DIAGNOSIS — Z8719 Personal history of other diseases of the digestive system: Secondary | ICD-10-CM | POA: Diagnosis not present

## 2014-11-11 DIAGNOSIS — S71112A Laceration without foreign body, left thigh, initial encounter: Secondary | ICD-10-CM | POA: Diagnosis not present

## 2014-11-11 DIAGNOSIS — S71111A Laceration without foreign body, right thigh, initial encounter: Secondary | ICD-10-CM | POA: Diagnosis not present

## 2014-11-11 DIAGNOSIS — W5503XA Scratched by cat, initial encounter: Secondary | ICD-10-CM | POA: Diagnosis not present

## 2014-11-11 DIAGNOSIS — Y9389 Activity, other specified: Secondary | ICD-10-CM | POA: Insufficient documentation

## 2014-11-11 NOTE — ED Provider Notes (Signed)
CSN: [id number]   Arrival date & time 11/11/14 2319  History  This chart was scribed for non-physician practitioner, Junius Creamer, NP working with Julianne Rice, MD by Altamease Oiler, ED Scribe. This patient was seen in room [place] and the patient's care was started at 11:57 PM.  Chief Complaint  Patient presents with  . cat scratches     HPI The history is provided by the patient. No language interpreter was used.   Alejandra Hebert is a 64 y.o. female who presents to the Emergency Department complaining of multiple scratches to the bilateral lower extremities. Tonight her cat became agitated after being tangled in a set of binoculars and attacked her.  Associated symptoms include pain and bruising at the site of the wounds. The cat was vaccinated in May. The patient's tetanus is out of date.    Past Medical History  Diagnosis Date  . Vertigo   . Allergic rhinitis   . HTN (hypertension)   . Irritable bowel syndrome   . Menopause   . Abnormal Pap smear 09/08/2004    LGSIL/ colpo 10/18/2004    Past Surgical History  Procedure Laterality Date  . Leep  09/17/2002    History reviewed. No pertinent family history.  Social History  Substance Use Topics  . Smoking status: Current Some Day Smoker    Last Attempt to Quit: 03/31/2011  . Smokeless tobacco: Never Used  . Alcohol Use: No     Review of Systems  Constitutional: Negative for fever.  Respiratory: Negative for cough.   Skin: Positive for wound.       Abrasions, bruises, and pain at the bilateral lower extremities  Neurological: Negative for dizziness and numbness.  All other systems reviewed and are negative.   Home Medications   Prior to Admission medications   Medication Sig Start Date End Date Taking? Authorizing Provider  amoxicillin-clavulanate (AUGMENTIN) 875-125 MG per tablet Take 1 tablet by mouth 2 (two) times daily. 11/12/14   Junius Creamer, NP  HYDROcodone-acetaminophen (NORCO/VICODIN) 5-325 MG per tablet  Take 1 tablet by mouth every 6 (six) hours as needed for moderate pain or severe pain. 11/12/14   Junius Creamer, NP  losartan (COZAAR) 25 MG tablet Take 25 mg by mouth daily.    Historical Provider, MD  lubiprostone (AMITIZA) 8 MCG capsule Take 1 capsule (8 mcg total) by mouth 2 (two) times daily with a meal. 12/21/13   Robyn Haber, MD  Multiple Vitamin (MULTIVITAMIN) tablet Take 1 tablet by mouth daily.    Historical Provider, MD  Multiple Vitamins-Minerals (ANTIOXIDANT PO) Take 1 tablet by mouth daily.    Historical Provider, MD    Allergies  Latex; Erythromycin; Other; and Sulfa antibiotics  Triage Vitals: BP 157/88 mmHg  Pulse 104  Temp(Src) 98.2 F (36.8 C) (Oral)  Resp 16  SpO2 95%  Physical Exam  Constitutional: She appears well-developed and well-nourished.  HENT:  Head: Normocephalic.  Eyes: Pupils are equal, round, and reactive to light.  Neck: Normal range of motion.  Cardiovascular: Normal rate.   Pulmonary/Chest: Effort normal.  Musculoskeletal: Normal range of motion. She exhibits tenderness. She exhibits no edema.  Multiple lacerations/ punctures to bilateral thighs, anterior surface as well as several more superficial lacerations on the shins  Neurological: She is alert.  Skin: Skin is warm.  Nursing note and vitals reviewed.   ED Course  Procedures  DIAGNOSTIC STUDIES: Oxygen Saturation is 95% on RA,  normal by my interpretation.    COORDINATION OF CARE:  12:00 AM Discussed treatment plan which includes wound care, pain management, and Tdap with pt at bedside and pt agreed to plan.  Labs Review- Labs Reviewed - No data to display  Imaging Review No results found.    All wounds have been cleaned and dressed with bacitracin and nonadherent dressing, i.e., Xeroform, tetanus has been updated and patient has been started on Augmentin.  She is to follow-up with her primary care physician in 2 days to have the wound rechecked She was given information on Cat  scratch Fever, just so she is aware of symptoms that would prompt her return  MDM   Final diagnoses:  Cat scratch of left lower leg, initial encounter  Cat scratch of right lower leg, initial encounter    I personally performed the services described in this documentation, which was scribed in my presence. The recorded information has been reviewed and is accurate.     Junius Creamer, NP 11/12/14 0103  Julianne Rice, MD 11/12/14 [phone]

## 2014-11-11 NOTE — ED Notes (Signed)
Pt states her cat got hung up in the cord of some binoculars and when she tried to free him he attacked her  Pt has scratches noted to both legs and one on her right arm  Pt does not take blood thinners  Pt has bruising noted to her legs as well

## 2014-11-12 MED ORDER — HYDROCODONE-ACETAMINOPHEN 5-325 MG PO TABS: 1.0000 | ORAL_TABLET | Freq: Four times a day (QID) | ORAL | Status: DC | PRN

## 2014-11-12 MED ORDER — TETANUS-DIPHTH-ACELL PERTUSSIS 5-2.5-18.5 LF-MCG/0.5 IM SUSP
0.5000 mL | Freq: Once | INTRAMUSCULAR | Status: AC
  Administered 2014-11-12: 0.5 mL via INTRAMUSCULAR
  Filled 2014-11-12: qty 0.5

## 2014-11-12 MED ORDER — AMOXICILLIN-POT CLAVULANATE 875-125 MG PO TABS: 1.0000 | ORAL_TABLET | Freq: Two times a day (BID) | ORAL | Status: DC

## 2014-11-12 MED ORDER — HYDROCODONE-ACETAMINOPHEN 5-325 MG PO TABS
1.0000 | ORAL_TABLET | Freq: Once | ORAL | Status: AC
  Administered 2014-11-12: 1 via ORAL
  Filled 2014-11-12: qty 1

## 2014-11-12 MED ORDER — AMOXICILLIN-POT CLAVULANATE 875-125 MG PO TABS
1.0000 | ORAL_TABLET | Freq: Once | ORAL | Status: AC
  Administered 2014-11-12: 1 via ORAL
  Filled 2014-11-12: qty 1

## 2014-11-12 NOTE — ED Notes (Signed)
AVS explained in detail. Dressed and cleaned all wounds thoroughly with sterile saline, bacitracin, xeroform, and double thickness dry dressings. Sent home with all supplies for dressing changes. Educated on any symptoms mandating return to ER. Given sandwich. Wound dressings still clean/dry/intact upon discharge. No other c/c.

## 2014-11-12 NOTE — Discharge Instructions (Signed)
° °  The wounds have been cleaned and dressed.  You've been started on anti-biotic as I percussion consistent infection.  You were given information on Catapres to sees if any of these symptoms occur, please see your primary care physician or the emergency department as soon as possible

## 2015-05-04 ENCOUNTER — Other Ambulatory Visit: Payer: Self-pay | Admitting: Gastroenterology

## 2015-05-06 ENCOUNTER — Ambulatory Visit (INDEPENDENT_AMBULATORY_CARE_PROVIDER_SITE_OTHER): Payer: PPO | Admitting: Family Medicine

## 2015-05-06 VITALS — BP 110/76 | HR 74 | Temp 98.2°F | Resp 16 | Ht 66.0 in | Wt 209.0 lb

## 2015-05-06 DIAGNOSIS — R05 Cough: Secondary | ICD-10-CM

## 2015-05-06 DIAGNOSIS — H6122 Impacted cerumen, left ear: Secondary | ICD-10-CM

## 2015-05-06 DIAGNOSIS — J0101 Acute recurrent maxillary sinusitis: Secondary | ICD-10-CM | POA: Diagnosis not present

## 2015-05-06 DIAGNOSIS — R059 Cough, unspecified: Secondary | ICD-10-CM

## 2015-05-06 DIAGNOSIS — J209 Acute bronchitis, unspecified: Secondary | ICD-10-CM | POA: Diagnosis not present

## 2015-05-06 MED ORDER — LEVOFLOXACIN 500 MG PO TABS: 500.0000 mg | ORAL_TABLET | Freq: Every day | ORAL | Status: DC

## 2015-05-06 MED ORDER — HYDROCOD POLST-CPM POLST ER 10-8 MG/5ML PO SUER
5.0000 mL | Freq: Two times a day (BID) | ORAL | Status: DC | PRN
Start: 2015-05-06 — End: 2018-01-20

## 2015-05-06 NOTE — Progress Notes (Signed)
Subjective:  This chart was scribed for Alejandra Haber MD,  by Tamsen Roers, at [facility].  This patient was seen in room 10 and the patient's care was started at 10:27 AM.   Chief Complaint  Patient presents with  . Cough    non productive -  ALL SXs x 2-3 days  . Nasal Congestion  . Sore Throat     Patient ID: Alejandra Hebert, female    DOB: 11-13-1950, 65 y.o.   MRN: [id number]  HPI HPI Comments: Alejandra Hebert is a 65 y.o. female who presents to the [facility] complaining of a non productive cough, nasal congestion and sore throat three days ago.  She states that her cough is bothering her more than anything.  Patient had associated symptoms of a fever (last night), rhinorrhea, and feels that her face is more swollen than usual.  She was feeling ill a little over a week ago and states that she started feeling better but her symptoms then worsened shortly after.   Patients family has also been sick the past couple of weeks.      Patient Active Problem List   Diagnosis Date Noted  . HGSIL (high grade squamous intraepithelial dysplasia) 09/21/2011  . Vertigo   . Allergic rhinitis   . HTN (hypertension)    Past Medical History  Diagnosis Date  . Vertigo   . Allergic rhinitis   . HTN (hypertension)   . Irritable bowel syndrome   . Menopause   . Abnormal Pap smear 09/08/2004    LGSIL/ colpo 10/18/2004   Past Surgical History  Procedure Laterality Date  . Leep  09/17/2002   Allergies  Allergen Reactions  . Latex Rash  . Erythromycin Nausea Only  . Other Nausea Only    Etodalac  . Sulfa Antibiotics Nausea Only   Prior to Admission medications   Medication Sig Start Date End Date Taking? Authorizing Provider  FIBER PO Take by mouth daily.   Yes Historical Provider, MD  losartan (COZAAR) 25 MG tablet Take 25 mg by mouth daily.   Yes Historical Provider, MD  Multiple Vitamin (MULTIVITAMIN) tablet Take 1 tablet by mouth  daily.   Yes Historical Provider, MD  Multiple Vitamins-Minerals (ANTIOXIDANT PO) Take 1 tablet by mouth daily.   Yes Historical Provider, MD  OMEPRAZOLE PO Take by mouth daily.   Yes Historical Provider, MD  amoxicillin-clavulanate (AUGMENTIN) 875-125 MG per tablet Take 1 tablet by mouth 2 (two) times daily. Patient not taking: Reported on 05/06/2015 11/12/14   Junius Creamer, NP  HYDROcodone-acetaminophen (NORCO/VICODIN) 5-325 MG per tablet Take 1 tablet by mouth every 6 (six) hours as needed for moderate pain or severe pain. Patient not taking: Reported on 05/06/2015 11/12/14   Junius Creamer, NP  lubiprostone (AMITIZA) 8 MCG capsule Take 1 capsule (8 mcg total) by mouth 2 (two) times daily with a meal. Patient not taking: Reported on 05/06/2015 12/21/13   Alejandra Haber, MD   Social History   Social History  . Marital Status: Divorced    Spouse Name: N/A  . Number of Children: N/A  . Years of Education: N/A   Occupational History  . Not on file.   Social History Main Topics  . Smoking status: Current Some Day Smoker    Last Attempt to Quit: 03/31/2011  . Smokeless tobacco: Never Used  . Alcohol Use: No  . Drug Use: No  . Sexual Activity: Not on file  Comment: meno    Other Topics Concern  . Not on file   Social History Narrative     Review of Systems  Constitutional: Positive for fever.  HENT: Positive for congestion, rhinorrhea, sinus pressure and sore throat.   Respiratory: Positive for cough. Negative for choking and shortness of breath.   Gastrointestinal: Negative for nausea and vomiting.  Musculoskeletal: Negative for neck pain and neck stiffness.  Neurological: Negative for seizures, syncope and speech difficulty.       Objective:   Physical Exam  Constitutional: She is oriented to person, place, and time. She appears well-developed and well-nourished. No distress.  HENT:  Head: Normocephalic and atraumatic.  Cerumen in left ear.  Nasal passages are swollen.    Eyes: Pupils are equal, round, and reactive to light.  Pulmonary/Chest: She has wheezes.  Rhonchi in both lung fields    Neurological: She is alert and oriented to person, place, and time.  Psychiatric: She has a normal mood and affect. Her behavior is normal.    Filed Vitals:   05/06/15 1007  BP: 110/76  Pulse: 74  Temp: 98.2 F (36.8 C)  TempSrc: Oral  Resp: 16  Height: 5\' 6"  (1.676 m)  Weight: 209 lb (94.802 kg)  SpO2: 94%         Assessment & Plan:   This chart was scribed in my presence and reviewed by me personally.    ICD-9-CM ICD-10-CM   1. Acute bronchitis, unspecified organism 466.0 J20.9 chlorpheniramine-HYDROcodone (TUSSIONEX PENNKINETIC ER) 10-8 MG/5ML SUER  2. Acute recurrent maxillary sinusitis 461.0 J01.01 levofloxacin (LEVAQUIN) 500 MG tablet  3. Cough 786.2 R05 chlorpheniramine-HYDROcodone (TUSSIONEX PENNKINETIC ER) 10-8 MG/5ML SUER  4. Cerumen impaction, left 380.4 H61.22 Ear wax removal     Signed, Alejandra Haber, MD

## 2015-05-06 NOTE — Patient Instructions (Signed)
Cerumen Impaction The structures of the external ear canal secrete a waxy substance known as cerumen. Excess cerumen can build up in the ear canal, causing a condition known as cerumen impaction. Cerumen impaction can cause ear pain and disrupt the function of the ear. The rate of cerumen production differs for each individual. In certain individuals, the configuration of the ear canal may decrease his or her ability to naturally remove cerumen. CAUSES Cerumen impaction is caused by excessive cerumen production or buildup. RISK FACTORS  Frequent use of swabs to clean ears.  Having narrow ear canals.  Having eczema.  Being dehydrated. SIGNS AND SYMPTOMS  Diminished hearing.  Ear drainage.  Ear pain.  Ear itch. TREATMENT Treatment may involve:  Over-the-counter or prescription ear drops to soften the cerumen.  Removal of cerumen by a health care provider. This may be done with:  Irrigation with warm water. This is the most common method of removal.  Ear curettes and other instruments.  Surgery. This may be done in severe cases. HOME CARE INSTRUCTIONS  Take medicines only as directed by your health care provider.  Do not insert objects into the ear with the intent of cleaning the ear. PREVENTION  Do not insert objects into the ear, even with the intent of cleaning the ear. Removing cerumen as a part of normal hygiene is not necessary, and the use of swabs in the ear canal is not recommended.  Drink enough water to keep your urine clear or pale yellow.  Control your eczema if you have it. SEEK MEDICAL CARE IF:  You develop ear pain.  You develop bleeding from the ear.  The cerumen does not clear after you use ear drops as directed.   This information is not intended to replace advice given to you by your health care provider. Make sure you discuss any questions you have with your health care provider.   Document Released: 03/23/2004 Document Revised: 03/06/2014  Document Reviewed: 09/30/2014 Elsevier Interactive Patient Education 2016 Elsevier Inc. Sinusitis, Adult Sinusitis is redness, soreness, and inflammation of the paranasal sinuses. Paranasal sinuses are air pockets within the bones of your face. They are located beneath your eyes, in the middle of your forehead, and above your eyes. In healthy paranasal sinuses, mucus is able to drain out, and air is able to circulate through them by way of your nose. However, when your paranasal sinuses are inflamed, mucus and air can become trapped. This can allow bacteria and other germs to grow and cause infection. Sinusitis can develop quickly and last only a short time (acute) or continue over a long period (chronic). Sinusitis that lasts for more than 12 weeks is considered chronic. CAUSES Causes of sinusitis include:  Allergies.  Structural abnormalities, such as displacement of the cartilage that separates your nostrils (deviated septum), which can decrease the air flow through your nose and sinuses and affect sinus drainage.  Functional abnormalities, such as when the small hairs (cilia) that line your sinuses and help remove mucus do not work properly or are not present. SIGNS AND SYMPTOMS Symptoms of acute and chronic sinusitis are the same. The primary symptoms are pain and pressure around the affected sinuses. Other symptoms include:  Upper toothache.  Earache.  Headache.  Bad breath.  Decreased sense of smell and taste.  A cough, which worsens when you are lying flat.  Fatigue.  Fever.  Thick drainage from your nose, which often is green and may contain pus (purulent).  Swelling and warmth over  the affected sinuses. DIAGNOSIS Your health care provider will perform a physical exam. During your exam, your health care provider may perform any of the following to help determine if you have acute sinusitis or chronic sinusitis:  Look in your nose for signs of abnormal growths in your  nostrils (nasal polyps).  Tap over the affected sinus to check for signs of infection.  View the inside of your sinuses using an imaging device that has a light attached (endoscope). If your health care provider suspects that you have chronic sinusitis, one or more of the following tests may be recommended:  Allergy tests.  Nasal culture. A sample of mucus is taken from your nose, sent to a lab, and screened for bacteria.  Nasal cytology. A sample of mucus is taken from your nose and examined by your health care provider to determine if your sinusitis is related to an allergy. TREATMENT Most cases of acute sinusitis are related to a viral infection and will resolve on their own within 10 days. Sometimes, medicines are prescribed to help relieve symptoms of both acute and chronic sinusitis. These may include pain medicines, decongestants, nasal steroid sprays, or saline sprays. However, for sinusitis related to a bacterial infection, your health care provider will prescribe antibiotic medicines. These are medicines that will help kill the bacteria causing the infection. Rarely, sinusitis is caused by a fungal infection. In these cases, your health care provider will prescribe antifungal medicine. For some cases of chronic sinusitis, surgery is needed. Generally, these are cases in which sinusitis recurs more than 3 times per year, despite other treatments. HOME CARE INSTRUCTIONS  Drink plenty of water. Water helps thin the mucus so your sinuses can drain more easily.  Use a humidifier.  Inhale steam 3-4 times a day (for example, sit in the bathroom with the shower running).  Apply a warm, moist washcloth to your face 3-4 times a day, or as directed by your health care provider.  Use saline nasal sprays to help moisten and clean your sinuses.  Take medicines only as directed by your health care provider.  If you were prescribed either an antibiotic or antifungal medicine, finish it all  even if you start to feel better. SEEK IMMEDIATE MEDICAL CARE IF:  You have increasing pain or severe headaches.  You have nausea, vomiting, or drowsiness.  You have swelling around your face.  You have vision problems.  You have a stiff neck.  You have difficulty breathing.   This information is not intended to replace advice given to you by your health care provider. Make sure you discuss any questions you have with your health care provider.   Document Released: 02/13/2005 Document Revised: 03/06/2014 Document Reviewed: 02/28/2011 Elsevier Interactive Patient Education 2016 Elsevier Inc. Acute Bronchitis Bronchitis is inflammation of the airways that extend from the windpipe into the lungs (bronchi). The inflammation often causes mucus to develop. This leads to a cough, which is the most common symptom of bronchitis.  In acute bronchitis, the condition usually develops suddenly and goes away over time, usually in a couple weeks. Smoking, allergies, and asthma can make bronchitis worse. Repeated episodes of bronchitis may cause further lung problems.  CAUSES Acute bronchitis is most often caused by the same virus that causes a cold. The virus can spread from person to person (contagious) through coughing, sneezing, and touching contaminated objects. SIGNS AND SYMPTOMS   Cough.   Fever.   Coughing up mucus.   Body aches.   Chest  congestion.   Chills.   Shortness of breath.   Sore throat.  DIAGNOSIS  Acute bronchitis is usually diagnosed through a physical exam. Your health care provider will also ask you questions about your medical history. Tests, such as chest X-rays, are sometimes done to rule out other conditions.  TREATMENT  Acute bronchitis usually goes away in a couple weeks. Oftentimes, no medical treatment is necessary. Medicines are sometimes given for relief of fever or cough. Antibiotic medicines are usually not needed but may be prescribed in certain  situations. In some cases, an inhaler may be recommended to help reduce shortness of breath and control the cough. A cool mist vaporizer may also be used to help thin bronchial secretions and make it easier to clear the chest.  HOME CARE INSTRUCTIONS  Get plenty of rest.   Drink enough fluids to keep your urine clear or pale yellow (unless you have a medical condition that requires fluid restriction). Increasing fluids may help thin your respiratory secretions (sputum) and reduce chest congestion, and it will prevent dehydration.   Take medicines only as directed by your health care provider.  If you were prescribed an antibiotic medicine, finish it all even if you start to feel better.  Avoid smoking and secondhand smoke. Exposure to cigarette smoke or irritating chemicals will make bronchitis worse. If you are a smoker, consider using nicotine gum or skin patches to help control withdrawal symptoms. Quitting smoking will help your lungs heal faster.   Reduce the chances of another bout of acute bronchitis by washing your hands frequently, avoiding people with cold symptoms, and trying not to touch your hands to your mouth, nose, or eyes.   Keep all follow-up visits as directed by your health care provider.  SEEK MEDICAL CARE IF: Your symptoms do not improve after 1 week of treatment.  SEEK IMMEDIATE MEDICAL CARE IF:  You develop an increased fever or chills.   You have chest pain.   You have severe shortness of breath.  You have bloody sputum.   You develop dehydration.  You faint or repeatedly feel like you are going to pass out.  You develop repeated vomiting.  You develop a severe headache. MAKE SURE YOU:   Understand these instructions.  Will watch your condition.  Will get help right away if you are not doing well or get worse.   This information is not intended to replace advice given to you by your health care provider. Make sure you discuss any questions  you have with your health care provider.   Document Released: 03/23/2004 Document Revised: 03/06/2014 Document Reviewed: 08/06/2012 Elsevier Interactive Patient Education [employer].

## 2015-11-11 ENCOUNTER — Ambulatory Visit (INDEPENDENT_AMBULATORY_CARE_PROVIDER_SITE_OTHER): Payer: PPO | Admitting: Family Medicine

## 2015-11-11 VITALS — BP 116/76 | HR 104 | Temp 98.3°F | Resp 17 | Ht 66.0 in | Wt 212.0 lb

## 2015-11-11 DIAGNOSIS — L711 Rhinophyma: Secondary | ICD-10-CM | POA: Diagnosis not present

## 2015-11-11 NOTE — Progress Notes (Signed)
Alejandra Hebert is a 65 y.o. female who presents to [facility] today for swelling on her nose:  1.  Swelling on nose:  Present for about 1 month.  Patient had furuncle on mid-nose middle of August.  Given Keflex on August 15.  No real improvement.  Went back on Aug 19, given doxycycline and had increased reflux/nausea.  Switched to [employer] and improved.  Still has slight scar from this.   Since then, she has noted some increased swelling and redness for the entire end of her nose.  No pain.  No drainage.  No further clogged pores.  No history of rosacea, though did get malar rash with heat from Septra.  Stopped after stopping septra.  Has not tried anything for relief.  Patient told by PCP that it was "bad circulation."  She came here today for second opinion.  ROS as above.  Pertinently, no chest pain, palpitations, SOB, Fever, Chills, Abd pain, N/V/D.   PMH reviewed. Patient is a nonsmoker.   Past Medical History:  Diagnosis Date  . Abnormal Pap smear 09/08/2004   LGSIL/ colpo 10/18/2004  . Allergic rhinitis   . HTN (hypertension)   . Irritable bowel syndrome   . Menopause   . Vertigo    Past Surgical History:  Procedure Laterality Date  . LEEP  09/17/2002    Medications reviewed. Current Outpatient Prescriptions  Medication Sig Dispense Refill  . losartan (COZAAR) 25 MG tablet Take 25 mg by mouth daily.    . Multiple Vitamin (MULTIVITAMIN) tablet Take 1 tablet by mouth daily.    . Multiple Vitamins-Minerals (ANTIOXIDANT PO) Take 1 tablet by mouth daily.    Marland Kitchen OMEPRAZOLE PO Take by mouth daily.    . chlorpheniramine-HYDROcodone (TUSSIONEX PENNKINETIC ER) 10-8 MG/5ML SUER Take 5 mLs by mouth every 12 (twelve) hours as needed. (Patient not taking: Reported on 11/11/2015) 100 mL 0  . HYDROcodone-acetaminophen (NORCO/VICODIN) 5-325 MG per tablet Take 1 tablet by mouth every 6 (six) hours as needed for moderate pain or severe pain. (Patient not taking: Reported on  11/11/2015) 10 tablet 0  . lubiprostone (AMITIZA) 8 MCG capsule Take 1 capsule (8 mcg total) by mouth 2 (two) times daily with a meal. (Patient not taking: Reported on 11/11/2015) 30 capsule 1   No current facility-administered medications for this visit.      Physical Exam:  BP 116/76 (BP Location: Right Arm, Patient Position: Sitting, Cuff Size: Large)   Pulse (!) 104   Temp 98.3 F (36.8 C) (Oral)   Resp 17   Ht 5\' 6"  (1.676 m)   Wt 212 lb (96.2 kg)   SpO2 93%   BMI 34.22 kg/m  Gen:  Alert, cooperative patient who appears stated age in no acute distress.  Vital signs reviewed. Head: [place]/AT.   Eyes:  EOMI, PERRL.   Ears:  External ears WNL, Bilateral TM's normal without retraction, redness or bulging. Nose:  Septum midline.  Has 0.3 mm scar noted anterior aspect of nose about midway up nose.  Entire bulbous end of nose does have some mild enlargement.  Trace erythema.  No skin changes.   Mouth:  MMM, tonsils non-erythematous, non-edematous.     Assessment and Plan: 1.  Rhinophyma: - possibly just secondary inflammation from her infection.   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  No personal or family history of rosacea.   - No red flags.   - due to mild appearance of symptoms, plan  to hold off on any treatment and see if this improves with time.  - FU with PCP.

## 2015-11-11 NOTE — Patient Instructions (Addendum)
I think you have the beginnings of rhinophyma.  This could also just be secondary inflammation from your recent infection.  This is a variant of rosacea.  Time will tell over it's progression.   It was good to see you today!  Remember, this is not rosacea of itself, but a variant.  The only info we have is generalized rosacea.   Rosacea Rosacea is a long-term (chronic) condition that affects the skin of the face, including the cheeks, nose, brow, and chin. This condition can also affect the eyes. Rosacea causes blood vessels near the surface of the skin to enlarge, which results in redness. CAUSES The cause of this condition is not known. Certain triggers can make rosacea worse, including:  Hot baths.  Exercise.  Sunlight.  Very hot or cold temperatures.  Hot or spicy foods and drinks.  Drinking alcohol.  Stress.  Taking blood pressure medicine.  Long-term use of topical steroids on the face. RISK FACTORS This condition is more likely to develop in:  People who are older than 66 years of age.  Women.  People who have light-colored skin (light complexion).  People who have a family history of rosacea. SYMPTOMS  Symptoms of this condition include:  Redness of the face.  Red bumps or pimples on the face.  A red, enlarged nose.  Blushing easily.  Red lines on the skin.  Irritated or burning feeling in the eyes.  Swollen eyelids. DIAGNOSIS This condition is diagnosed with a medical history and physical exam. TREATMENT There is no cure for this condition, but treatment can help to control your symptoms. Your health care provider may recommend that you see a skin specialist (dermatologist). Treatment may include:  Antibiotic medicines that are applied to the skin or taken as a pill.  Laser treatment to improve the appearance of the skin.  Surgery. This is rare. Your health care provider will also recommend the best way to take care of your skin. Even after  your skin improves, you will likely need to continue treatment to prevent your rosacea from coming back. HOME CARE INSTRUCTIONS Skin Care Take care of your skin as told by your health care provider. You may be told to do these things:  Wash your skin gently two or more times each day.  Use mild soap.  Use a sunscreen or sunblock with SPF 30 or greater.  Use gentle cosmetics that are meant for sensitive skin.  Shave with an electric shaver instead of a blade. Lifestyle  Try to keep track of what foods trigger this condition. Avoid any triggers. These may include:  Spicy foods.  Seafood.  Cheese.  Hot liquids.  Nuts.  Chocolate.  Iodized salt.  Do not drink alcohol.  Avoid extremely cold or hot temperatures.  Try to reduce your stress. If you need help, talk with your health care provider.  When you exercise, do these things to stay cool:  Limit your sun exposure.  Use a fan.  Do shorter and more frequent intervals of exercise. General Instructions   Keep all follow-up visits as told by your health care provider. This is important.  Take over-the-counter and prescription medicines only as told by your health care provider.  If your eyelids are affected, apply warm compresses to them. Do this as told by your health care provider.  If you were prescribed an antibiotic medicine, apply or take it as told by your health care provider. Do not stop using the antibiotic even if your condition  improves. SEEK MEDICAL CARE IF:  Your symptoms get worse.  Your symptoms do not improve after two months of treatment.  You have new symptoms.  You have any changes in vision or you have problems with your eyes, such as redness or itching.  You feel depressed.  You lose your appetite.  You have trouble concentrating.   This information is not intended to replace advice given to you by your health care provider. Make sure you discuss any questions you have with your  health care provider.   Document Released: 03/23/2004 Document Revised: 11/04/2014 Document Reviewed: 04/22/2014 Elsevier Interactive Patient Education 2016 [employer].     IF you received an x-ray today, you will receive an invoice from [facility]. Please contact [facility] at [phone] with questions or concerns regarding your invoice.   IF you received labwork today, you will receive an invoice from [employer]. Please contact [facility] at [phone] with questions or concerns regarding your invoice.   Our billing staff will not be able to assist you with questions regarding bills from these companies.  You will be contacted with the lab results as soon as they are available. The fastest way to get your results is to activate your My Chart account. Instructions are located on the last page of this paperwork. If you have not heard from [place] regarding the results in 2 weeks, please contact this office.

## 2016-04-20 DIAGNOSIS — M9903 Segmental and somatic dysfunction of lumbar region: Secondary | ICD-10-CM | POA: Diagnosis not present

## 2016-04-20 DIAGNOSIS — M5441 Lumbago with sciatica, right side: Secondary | ICD-10-CM | POA: Diagnosis not present

## 2016-04-24 DIAGNOSIS — M5441 Lumbago with sciatica, right side: Secondary | ICD-10-CM | POA: Diagnosis not present

## 2016-04-24 DIAGNOSIS — M9903 Segmental and somatic dysfunction of lumbar region: Secondary | ICD-10-CM | POA: Diagnosis not present

## 2016-04-26 DIAGNOSIS — M5441 Lumbago with sciatica, right side: Secondary | ICD-10-CM | POA: Diagnosis not present

## 2016-04-26 DIAGNOSIS — M9903 Segmental and somatic dysfunction of lumbar region: Secondary | ICD-10-CM | POA: Diagnosis not present

## 2016-05-01 DIAGNOSIS — M5441 Lumbago with sciatica, right side: Secondary | ICD-10-CM | POA: Diagnosis not present

## 2016-05-01 DIAGNOSIS — M9903 Segmental and somatic dysfunction of lumbar region: Secondary | ICD-10-CM | POA: Diagnosis not present

## 2016-05-31 DIAGNOSIS — J3489 Other specified disorders of nose and nasal sinuses: Secondary | ICD-10-CM | POA: Diagnosis not present

## 2016-07-17 DIAGNOSIS — Z1231 Encounter for screening mammogram for malignant neoplasm of breast: Secondary | ICD-10-CM | POA: Diagnosis not present

## 2016-09-05 DIAGNOSIS — K219 Gastro-esophageal reflux disease without esophagitis: Secondary | ICD-10-CM | POA: Diagnosis not present

## 2016-10-03 DIAGNOSIS — I10 Essential (primary) hypertension: Secondary | ICD-10-CM | POA: Diagnosis not present

## 2016-10-03 DIAGNOSIS — Z1389 Encounter for screening for other disorder: Secondary | ICD-10-CM | POA: Diagnosis not present

## 2016-10-03 DIAGNOSIS — Z13220 Encounter for screening for lipoid disorders: Secondary | ICD-10-CM | POA: Diagnosis not present

## 2016-10-03 DIAGNOSIS — R69 Illness, unspecified: Secondary | ICD-10-CM | POA: Diagnosis not present

## 2016-10-03 DIAGNOSIS — K219 Gastro-esophageal reflux disease without esophagitis: Secondary | ICD-10-CM | POA: Diagnosis not present

## 2016-10-31 DIAGNOSIS — H2513 Age-related nuclear cataract, bilateral: Secondary | ICD-10-CM | POA: Diagnosis not present

## 2016-10-31 DIAGNOSIS — D3132 Benign neoplasm of left choroid: Secondary | ICD-10-CM | POA: Diagnosis not present

## 2016-10-31 DIAGNOSIS — H43813 Vitreous degeneration, bilateral: Secondary | ICD-10-CM | POA: Diagnosis not present

## 2016-11-08 DIAGNOSIS — K648 Other hemorrhoids: Secondary | ICD-10-CM | POA: Diagnosis not present

## 2016-11-09 DIAGNOSIS — K649 Unspecified hemorrhoids: Secondary | ICD-10-CM | POA: Diagnosis not present

## 2016-11-09 DIAGNOSIS — K5900 Constipation, unspecified: Secondary | ICD-10-CM | POA: Diagnosis not present

## 2016-11-17 DIAGNOSIS — K648 Other hemorrhoids: Secondary | ICD-10-CM | POA: Diagnosis not present

## 2016-11-17 DIAGNOSIS — K5900 Constipation, unspecified: Secondary | ICD-10-CM | POA: Diagnosis not present

## 2016-11-17 DIAGNOSIS — K644 Residual hemorrhoidal skin tags: Secondary | ICD-10-CM | POA: Diagnosis not present

## 2016-11-29 DIAGNOSIS — K648 Other hemorrhoids: Secondary | ICD-10-CM | POA: Diagnosis not present

## 2016-11-29 DIAGNOSIS — K644 Residual hemorrhoidal skin tags: Secondary | ICD-10-CM | POA: Diagnosis not present

## 2016-11-29 DIAGNOSIS — K5900 Constipation, unspecified: Secondary | ICD-10-CM | POA: Diagnosis not present

## 2016-12-06 DIAGNOSIS — K649 Unspecified hemorrhoids: Secondary | ICD-10-CM | POA: Diagnosis not present

## 2016-12-11 ENCOUNTER — Telehealth: Payer: Self-pay | Admitting: Gastroenterology

## 2016-12-11 NOTE — Telephone Encounter (Signed)
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 Patient is wanting to transfer back to our office from [place]. She states that [facility] office is very unprofessional and will have records faxed to our office for review.

## 2016-12-27 DIAGNOSIS — K649 Unspecified hemorrhoids: Secondary | ICD-10-CM | POA: Diagnosis not present

## 2017-01-15 DIAGNOSIS — H6123 Impacted cerumen, bilateral: Secondary | ICD-10-CM | POA: Diagnosis not present

## 2017-03-14 DIAGNOSIS — K649 Unspecified hemorrhoids: Secondary | ICD-10-CM | POA: Diagnosis not present

## 2017-04-05 DIAGNOSIS — J209 Acute bronchitis, unspecified: Secondary | ICD-10-CM | POA: Diagnosis not present

## 2017-04-17 DIAGNOSIS — K644 Residual hemorrhoidal skin tags: Secondary | ICD-10-CM | POA: Diagnosis not present

## 2017-04-17 DIAGNOSIS — E669 Obesity, unspecified: Secondary | ICD-10-CM | POA: Diagnosis not present

## 2017-04-17 DIAGNOSIS — I10 Essential (primary) hypertension: Secondary | ICD-10-CM | POA: Diagnosis not present

## 2017-04-17 DIAGNOSIS — R69 Illness, unspecified: Secondary | ICD-10-CM | POA: Diagnosis not present

## 2017-04-17 DIAGNOSIS — K219 Gastro-esophageal reflux disease without esophagitis: Secondary | ICD-10-CM | POA: Diagnosis not present

## 2017-04-26 DIAGNOSIS — L309 Dermatitis, unspecified: Secondary | ICD-10-CM | POA: Diagnosis not present

## 2017-04-26 DIAGNOSIS — L259 Unspecified contact dermatitis, unspecified cause: Secondary | ICD-10-CM | POA: Diagnosis not present

## 2017-05-29 ENCOUNTER — Ambulatory Visit: Payer: Medicare HMO | Admitting: Podiatry

## 2017-05-29 ENCOUNTER — Ambulatory Visit (INDEPENDENT_AMBULATORY_CARE_PROVIDER_SITE_OTHER): Payer: Medicare HMO

## 2017-05-29 ENCOUNTER — Encounter: Payer: Self-pay | Admitting: Podiatry

## 2017-05-29 VITALS — BP 147/87 | HR 85

## 2017-05-29 DIAGNOSIS — M722 Plantar fascial fibromatosis: Secondary | ICD-10-CM | POA: Diagnosis not present

## 2017-05-29 DIAGNOSIS — M7661 Achilles tendinitis, right leg: Secondary | ICD-10-CM | POA: Diagnosis not present

## 2017-05-29 DIAGNOSIS — M7751 Other enthesopathy of right foot: Secondary | ICD-10-CM

## 2017-05-29 MED ORDER — MELOXICAM 7.5 MG PO TABS: 7.5000 mg | ORAL_TABLET | Freq: Every day | ORAL | 0 refills | Status: DC

## 2017-05-29 NOTE — Patient Instructions (Signed)

## 2017-05-29 NOTE — Progress Notes (Signed)
This patient presents to the office with chief complaint of pain in the back of her right heel.  She says that this has been painful for the last 3-4 months. No history of trauma or injury to the foot. She points to the area of the insertion of the Achilles tendon, right foot.  She admits she does better in some shoes than other shoes.  She says her pain is about 4 out of 10. She presents the office today for an evaluation and treatment of this painful area on the back of her right foot.  General Appearance  Alert, conversant and in no acute stress.  Vascular  Dorsalis pedis and posterior tibial  pulses are palpable  bilaterally.  Capillary return is within normal limits  bilaterally. Temperature is within normal limits  bilaterally.  Neurologic  Senn-Weinstein monofilament wire test within normal limits  bilaterally. Muscle power within normal limits bilaterally.  Nails normal nails noted without any evidence of fungal or bacterial infection.  Orthopedic  No limitations of motion of motion feet .  No crepitus or effusions noted. Pain is elicited upon palpation of the insertion of the Achilles tendon, right foot.  Minimal swelling noted at the insertion of the Achilles tendon.    Skin  normotropic skin with no porokeratosis noted bilaterally.  No signs of infections or ulcers noted.  Retrocalcaneal bursitis right  Achilles tendinitis right foot.   IE  X-rays taken reveal a severely arthritic big toe joint, right foot.  Minimal calcfication of the plantar fascia right foot.  No calcification noted at the insertion of the Achilles tendon, right calcaneus.  Discussed this condition with this patient and we are going to treat her with heel lifts in her shoes as well as Mobic  by mouth .  She is to return to the office in 3 weeks for further evaluation and treatment.   Gardiner Barefoot DPM

## 2017-06-20 ENCOUNTER — Ambulatory Visit: Payer: Medicare HMO | Admitting: Podiatry

## 2017-06-20 ENCOUNTER — Encounter: Payer: Self-pay | Admitting: Podiatry

## 2017-06-20 DIAGNOSIS — M7661 Achilles tendinitis, right leg: Secondary | ICD-10-CM | POA: Diagnosis not present

## 2017-06-20 DIAGNOSIS — M7751 Other enthesopathy of right foot: Secondary | ICD-10-CM

## 2017-06-20 NOTE — Progress Notes (Signed)
This patient returns to the office follow-up for a retrocalcaneal bursitis and Achilles tendinitis, right foot.  Patient was treated with a heel lift in her shoes as well as Mobic by mouth.  . She says the Mobic made her sick and discontinue taking the medicine after 2 days.   She says she takes ibuprofen as needed and has been exercising and stretching her right heel.  . She says she has improved but she still has occasional pain and discomfort.  She says she drove back from the beach yesterday and the pressure on the right heel caused pain and swelling to occur.  . She returns to the office today to discuss her right foot and future treatment.  General Appearance  Alert, conversant and in no acute stress.  Vascular  Dorsalis pedis and posterior tibial  pulses are palpable  bilaterally.  Capillary return is within normal limits  bilaterally. Temperature is within normal limits  bilaterally.  Neurologic  Senn-Weinstein monofilament wire test within normal limits  bilaterally. Muscle power within normal limits bilaterally.  Nails Thick disfigured discolored nails with subungual debris  from hallux to fifth toes bilaterally. No evidence of bacterial infection or drainage bilaterally.  Orthopedic  No limitations of motion of motion feet .  No crepitus or effusions noted.  No bony pathology or digital deformities noted. . Retrocalcaneal bone spur noted on the posterior aspect of the right heel.  . Mild pain and mild swelling noted at the site of the spur.  Skin  normotropic skin with no porokeratosis noted bilaterally.  No signs of infections or ulcers noted.    Retrocalcaneal spur  Achilles tendinitis right foot.  ROV.  Told this patient to continue the ibuprofen and the stretches since this formula help to relieve her pain.  She should continue taking ibuprofen 200 mg . We discussed her fungus toenails and I informed her that I do not prescribe my patients Lamisil due to possible liver problems.   RTC  prn.  . Discussed possible future treatment such as injection therapy. Medrol Dosepak and/or Cam Walker.  RTC prn.   Gardiner Barefoot DPM

## 2017-06-25 ENCOUNTER — Other Ambulatory Visit: Payer: Self-pay | Admitting: Podiatry

## 2017-08-06 DIAGNOSIS — Z124 Encounter for screening for malignant neoplasm of cervix: Secondary | ICD-10-CM | POA: Diagnosis not present

## 2017-08-06 DIAGNOSIS — Z01419 Encounter for gynecological examination (general) (routine) without abnormal findings: Secondary | ICD-10-CM | POA: Diagnosis not present

## 2017-08-06 DIAGNOSIS — Z6834 Body mass index (BMI) 34.0-34.9, adult: Secondary | ICD-10-CM | POA: Diagnosis not present

## 2017-08-06 DIAGNOSIS — Z1231 Encounter for screening mammogram for malignant neoplasm of breast: Secondary | ICD-10-CM | POA: Diagnosis not present

## 2017-08-17 ENCOUNTER — Telehealth: Payer: Self-pay | Admitting: Podiatry

## 2017-08-17 NOTE — Telephone Encounter (Signed)
I informed pt Dr. Prudence Davidson wanted to see her again prior to prescribing a brace. Pt states understanding and I transferred pt to scheduler.

## 2017-08-17 NOTE — Telephone Encounter (Signed)
Pt is just calling to see if you where she can get a brace from or if she would need to schedule an appointment to come back in. Dr. Prudence Davidson had spoke with her before about getting a brace if pain continues.

## 2017-08-20 ENCOUNTER — Ambulatory Visit: Payer: Medicare HMO | Admitting: Podiatry

## 2017-08-20 ENCOUNTER — Encounter: Payer: Self-pay | Admitting: Podiatry

## 2017-08-20 ENCOUNTER — Telehealth: Payer: Self-pay | Admitting: Podiatry

## 2017-08-20 DIAGNOSIS — M7751 Other enthesopathy of right foot: Secondary | ICD-10-CM | POA: Diagnosis not present

## 2017-08-20 DIAGNOSIS — M7661 Achilles tendinitis, right leg: Secondary | ICD-10-CM | POA: Diagnosis not present

## 2017-08-20 MED ORDER — DICLOFENAC SODIUM 1 % TD GEL: 2.0000 g | Freq: Four times a day (QID) | TRANSDERMAL | 2 refills | Status: DC

## 2017-08-20 NOTE — Telephone Encounter (Signed)
I informed pt our office had not received a PA for the Voltaren Gel, that our main fax was out of order, but if she would have the pharmacy fax to [phone], I would make sure there Pre-cert person would get it.

## 2017-08-20 NOTE — Telephone Encounter (Signed)
Pt called concerning Pre-authorization needed for Gel. She said the Pharmacy has already sent a form over to our office and they have not heard from [place]. Patient would like a callback to find out, what the procedure is for authorization.

## 2017-08-21 ENCOUNTER — Telehealth: Payer: Self-pay | Admitting: Podiatry

## 2017-08-21 NOTE — Telephone Encounter (Signed)
PA request from [facility] given to Gretta Arab, RN for pre-cert.

## 2017-08-21 NOTE — Telephone Encounter (Signed)
Patient called and stated she will just pay for medication out of pocket instead of everyone jumping through hoops with [employer] her insurance. I did let Marcy Siren know

## 2017-08-21 NOTE — Progress Notes (Signed)
Subjective: 67 year old female presents the office today with concerns of recurrent pain in the back of the right heel.  She states that when she first started seeing Dr. Prudence Davidson she was doing better however she states that she has not been doing what she is supposed to be the last couple days now is flared back up.  She states there is ongoing pain although not consistent is every day and she is getting frustrated with the pain.  She denies any recent injury or trauma to her feet and she denies any increase in swelling recently. Denies any systemic complaints such as fevers, chills, nausea, vomiting. No acute changes since last appointment, and no other complaints at this time.   Objective: AAO x3, NAD DP/PT pulses palpable bilaterally, CRT less than 3 seconds There is prominence of the posterior calcaneus and there is tenderness palpation of this area there is mild edema to this area.  Thompson test is negative there is no pain on the Achilles tendon on the mid substance.  There is no significant discomfort on the plantar medial tubercle of the calcaneus at the insertion of plantar fascia.  There is no pain with lateral compression of calcaneus.  Equinus is present.  No other areas of tenderness. No open lesions or pre-ulcerative lesions.  No pain with calf compression, swelling, warmth, erythema  Assessment: 68 year old female with Achilles insertional tendinitis right foot, spur  Plan: -All treatment options discussed with the patient including all alternatives, risks, complications.  -Review the chart.  At this time we discussed further treatment options.  She is taking anti-inflammatories.  Prescribed Voltaren gel as well.  Continue stretching, icing daily.  Also dispensed a night splint.  She is asking for a walking boot.  It is for short-term and this was dispensed today.  As her pain starts to subside on her low back into a regular shoe. -Patient encouraged to call the office with any  questions, concerns, change in symptoms.   RTC 3-4 weeks or sooner if needed  Trula Slade DPM

## 2017-08-22 ENCOUNTER — Telehealth: Payer: Self-pay | Admitting: Podiatry

## 2017-08-22 NOTE — Telephone Encounter (Signed)
Pt states she has put on 2 socks to cushion the right heel from the back of the walking boot, but still has burning pain and her toes rub the bottom sole of the walking boot with both socks on and it is too tight. Pt states she can not wear the night splint at night, it keeps her awake. I told pt I would help her, in office to fit the walking boot, and to wear the night splint when resting if she can not wear at bedtime. Pt states she will be in today or tomorrow.

## 2017-08-22 NOTE — Telephone Encounter (Signed)
Pt called to get some advice on her boots that she was given to wear.

## 2017-08-22 NOTE — Telephone Encounter (Addendum)
Pt presents to office for fitting of CAM walker and night splint. I removed the liner from the CAM walker and fitted on pt's foot, then had pt stand and place the foot in the liner 1/2" from the raised edge of the CAM walker, so the back of the heel would not rub or press the raised edge. Pt states she feels much better, still some burning and her short white sock was pressing on the sides of her heel. I told pt she may find a long sock would give more even padding without the elastic pressing right at the achilles tendon area. I told pt to put the night splint on bent knee to force the heel into the angle, then velcro the center strap, then the strap over the toe and then the shin, if uncomfortable may place ice pack behind and beneath to provide ice therapy and padding, to keep a sock on while icing. Pt states understanding, and is getting some relief with the voltaren gel. I told pt to call with concerns.

## 2017-09-11 ENCOUNTER — Ambulatory Visit: Payer: Medicare HMO | Admitting: Podiatry

## 2017-09-11 ENCOUNTER — Encounter: Payer: Self-pay | Admitting: Podiatry

## 2017-09-11 DIAGNOSIS — M7661 Achilles tendinitis, right leg: Secondary | ICD-10-CM | POA: Diagnosis not present

## 2017-09-11 DIAGNOSIS — M7751 Other enthesopathy of right foot: Secondary | ICD-10-CM

## 2017-09-12 NOTE — Progress Notes (Signed)
Subjective: 67 year old female presents the office today for follow-up evaluation of heel pain, Posterior.  She states the pain is intermittent she has good days and bad days.  She does not wear the walking boot much.  Icing does seem to help.  She states the Voltaren gel helped for some time.  She denies any recent injury.  She still recalls some swelling to the back of the heel but at times. Denies any systemic complaints such as fevers, chills, nausea, vomiting. No acute changes since last appointment, and no other complaints at this time.   Objective: AAO x3, NAD DP/PT pulses palpable bilaterally, CRT less than 3 seconds There is tenderness palpation to the posterior aspect of the right heel and the insertion of the Achilles tendon into the calcaneus.  No significant erythema there is no increase in warmth there is very faint erythema but this is more from where it rubs inside shoes and pressure.  There is no pain with lateral compression of calcaneus.  Thompson test is negative the Achilles tendon appears to be intact.  No open lesions or pre-ulcerative lesions.  No pain with calf compression, swelling, warmth, erythema  Assessment: Insertional Achilles tendinitis  Plan: -All treatment options discussed with the patient including all alternatives, risks, complications.  -At this time we discussed treatment options and I do think that physical therapy would be beneficial for her.  She does stretching intermittently but also they can do iontophoresis which will likely be beneficial for her.  Continue with home stretching, icing daily as well as supportive shoes, night splint as well as the Voltaren gel.  Discussed an MRI but she wishes to hold off on that. -Patient encouraged to call the office with any questions, concerns, change in symptoms.   Trula Slade DPM

## 2017-09-14 DIAGNOSIS — M25471 Effusion, right ankle: Secondary | ICD-10-CM | POA: Diagnosis not present

## 2017-09-14 DIAGNOSIS — M25571 Pain in right ankle and joints of right foot: Secondary | ICD-10-CM | POA: Diagnosis not present

## 2017-09-14 DIAGNOSIS — M25671 Stiffness of right ankle, not elsewhere classified: Secondary | ICD-10-CM | POA: Diagnosis not present

## 2017-09-14 DIAGNOSIS — R269 Unspecified abnormalities of gait and mobility: Secondary | ICD-10-CM | POA: Diagnosis not present

## 2017-09-18 DIAGNOSIS — M25671 Stiffness of right ankle, not elsewhere classified: Secondary | ICD-10-CM | POA: Diagnosis not present

## 2017-09-18 DIAGNOSIS — M25471 Effusion, right ankle: Secondary | ICD-10-CM | POA: Diagnosis not present

## 2017-09-18 DIAGNOSIS — R269 Unspecified abnormalities of gait and mobility: Secondary | ICD-10-CM | POA: Diagnosis not present

## 2017-09-18 DIAGNOSIS — M25571 Pain in right ankle and joints of right foot: Secondary | ICD-10-CM | POA: Diagnosis not present

## 2017-09-20 ENCOUNTER — Telehealth: Payer: Self-pay | Admitting: *Deleted

## 2017-09-20 DIAGNOSIS — M25471 Effusion, right ankle: Secondary | ICD-10-CM | POA: Diagnosis not present

## 2017-09-20 DIAGNOSIS — M25571 Pain in right ankle and joints of right foot: Secondary | ICD-10-CM | POA: Diagnosis not present

## 2017-09-20 DIAGNOSIS — R269 Unspecified abnormalities of gait and mobility: Secondary | ICD-10-CM | POA: Diagnosis not present

## 2017-09-20 DIAGNOSIS — M25671 Stiffness of right ankle, not elsewhere classified: Secondary | ICD-10-CM | POA: Diagnosis not present

## 2017-09-20 MED ORDER — NONFORMULARY OR COMPOUNDED ITEM: 2 refills | Status: DC

## 2017-09-20 NOTE — Telephone Encounter (Signed)
I informed pt of the orders for Dexamethasone 0.4% for the iontophoresis. Orders called to [facility].

## 2017-09-25 DIAGNOSIS — M25671 Stiffness of right ankle, not elsewhere classified: Secondary | ICD-10-CM | POA: Diagnosis not present

## 2017-09-25 DIAGNOSIS — M25571 Pain in right ankle and joints of right foot: Secondary | ICD-10-CM | POA: Diagnosis not present

## 2017-09-25 DIAGNOSIS — R269 Unspecified abnormalities of gait and mobility: Secondary | ICD-10-CM | POA: Diagnosis not present

## 2017-09-25 DIAGNOSIS — M25471 Effusion, right ankle: Secondary | ICD-10-CM | POA: Diagnosis not present

## 2017-09-27 DIAGNOSIS — M25471 Effusion, right ankle: Secondary | ICD-10-CM | POA: Diagnosis not present

## 2017-09-27 DIAGNOSIS — R269 Unspecified abnormalities of gait and mobility: Secondary | ICD-10-CM | POA: Diagnosis not present

## 2017-09-27 DIAGNOSIS — M25671 Stiffness of right ankle, not elsewhere classified: Secondary | ICD-10-CM | POA: Diagnosis not present

## 2017-09-27 DIAGNOSIS — M25571 Pain in right ankle and joints of right foot: Secondary | ICD-10-CM | POA: Diagnosis not present

## 2017-10-02 DIAGNOSIS — R269 Unspecified abnormalities of gait and mobility: Secondary | ICD-10-CM | POA: Diagnosis not present

## 2017-10-02 DIAGNOSIS — M25571 Pain in right ankle and joints of right foot: Secondary | ICD-10-CM | POA: Diagnosis not present

## 2017-10-02 DIAGNOSIS — M25471 Effusion, right ankle: Secondary | ICD-10-CM | POA: Diagnosis not present

## 2017-10-02 DIAGNOSIS — M25671 Stiffness of right ankle, not elsewhere classified: Secondary | ICD-10-CM | POA: Diagnosis not present

## 2017-10-04 DIAGNOSIS — M25571 Pain in right ankle and joints of right foot: Secondary | ICD-10-CM | POA: Diagnosis not present

## 2017-10-04 DIAGNOSIS — M25471 Effusion, right ankle: Secondary | ICD-10-CM | POA: Diagnosis not present

## 2017-10-04 DIAGNOSIS — M25671 Stiffness of right ankle, not elsewhere classified: Secondary | ICD-10-CM | POA: Diagnosis not present

## 2017-10-04 DIAGNOSIS — R269 Unspecified abnormalities of gait and mobility: Secondary | ICD-10-CM | POA: Diagnosis not present

## 2017-10-05 DIAGNOSIS — J029 Acute pharyngitis, unspecified: Secondary | ICD-10-CM | POA: Diagnosis not present

## 2017-10-11 DIAGNOSIS — M7661 Achilles tendinitis, right leg: Secondary | ICD-10-CM | POA: Diagnosis not present

## 2017-10-11 DIAGNOSIS — M7731 Calcaneal spur, right foot: Secondary | ICD-10-CM | POA: Diagnosis not present

## 2017-10-11 DIAGNOSIS — M79671 Pain in right foot: Secondary | ICD-10-CM | POA: Diagnosis not present

## 2017-10-11 DIAGNOSIS — M71571 Other bursitis, not elsewhere classified, right ankle and foot: Secondary | ICD-10-CM | POA: Diagnosis not present

## 2017-10-24 DIAGNOSIS — M7661 Achilles tendinitis, right leg: Secondary | ICD-10-CM | POA: Diagnosis not present

## 2017-10-24 DIAGNOSIS — M71571 Other bursitis, not elsewhere classified, right ankle and foot: Secondary | ICD-10-CM | POA: Diagnosis not present

## 2017-11-08 DIAGNOSIS — M7661 Achilles tendinitis, right leg: Secondary | ICD-10-CM | POA: Diagnosis not present

## 2017-11-08 DIAGNOSIS — M71571 Other bursitis, not elsewhere classified, right ankle and foot: Secondary | ICD-10-CM | POA: Diagnosis not present

## 2017-11-12 DIAGNOSIS — H2513 Age-related nuclear cataract, bilateral: Secondary | ICD-10-CM | POA: Diagnosis not present

## 2017-11-12 DIAGNOSIS — H43813 Vitreous degeneration, bilateral: Secondary | ICD-10-CM | POA: Diagnosis not present

## 2017-11-12 DIAGNOSIS — H11153 Pinguecula, bilateral: Secondary | ICD-10-CM | POA: Diagnosis not present

## 2017-11-12 DIAGNOSIS — D3132 Benign neoplasm of left choroid: Secondary | ICD-10-CM | POA: Diagnosis not present

## 2017-12-06 DIAGNOSIS — M7661 Achilles tendinitis, right leg: Secondary | ICD-10-CM | POA: Diagnosis not present

## 2017-12-11 DIAGNOSIS — Z0100 Encounter for examination of eyes and vision without abnormal findings: Secondary | ICD-10-CM | POA: Diagnosis not present

## 2017-12-20 DIAGNOSIS — M7661 Achilles tendinitis, right leg: Secondary | ICD-10-CM | POA: Diagnosis not present

## 2017-12-20 DIAGNOSIS — M71571 Other bursitis, not elsewhere classified, right ankle and foot: Secondary | ICD-10-CM | POA: Diagnosis not present

## 2018-01-09 DIAGNOSIS — M722 Plantar fascial fibromatosis: Secondary | ICD-10-CM | POA: Diagnosis not present

## 2018-01-09 DIAGNOSIS — M71571 Other bursitis, not elsewhere classified, right ankle and foot: Secondary | ICD-10-CM | POA: Diagnosis not present

## 2018-01-15 DIAGNOSIS — M722 Plantar fascial fibromatosis: Secondary | ICD-10-CM | POA: Diagnosis not present

## 2018-01-15 DIAGNOSIS — M7661 Achilles tendinitis, right leg: Secondary | ICD-10-CM | POA: Diagnosis not present

## 2018-01-18 DIAGNOSIS — I10 Essential (primary) hypertension: Secondary | ICD-10-CM | POA: Diagnosis not present

## 2018-01-18 DIAGNOSIS — R69 Illness, unspecified: Secondary | ICD-10-CM | POA: Diagnosis not present

## 2018-01-20 ENCOUNTER — Other Ambulatory Visit: Payer: Self-pay

## 2018-01-20 ENCOUNTER — Emergency Department (HOSPITAL_COMMUNITY)
Admission: EM | Admit: 2018-01-20 | Discharge: 2018-01-20 | Disposition: A | Discharge status: Home / Self Care | Payer: Medicare HMO | Attending: Emergency Medicine | Admitting: Emergency Medicine

## 2018-01-20 ENCOUNTER — Encounter (HOSPITAL_COMMUNITY): Payer: Self-pay | Admitting: *Deleted

## 2018-01-20 DIAGNOSIS — Z79899 Other long term (current) drug therapy: Secondary | ICD-10-CM | POA: Diagnosis not present

## 2018-01-20 DIAGNOSIS — I10 Essential (primary) hypertension: Secondary | ICD-10-CM | POA: Insufficient documentation

## 2018-01-20 DIAGNOSIS — Z91040 Latex allergy status: Secondary | ICD-10-CM | POA: Insufficient documentation

## 2018-01-20 LAB — CBC WITH DIFFERENTIAL/PLATELET
Abs Immature Granulocytes: 0.01 10*3/uL (ref 0.00–0.07)
BASOS ABS: 0 10*3/uL (ref 0.0–0.1)
Basophils Relative: 1 %
EOS ABS: 0.1 10*3/uL (ref 0.0–0.5)
EOS PCT: 2 %
HCT: 42 % (ref 36.0–46.0)
Hemoglobin: 13.5 g/dL (ref 12.0–15.0)
Immature Granulocytes: 0 %
Lymphocytes Relative: 21 %
Lymphs Abs: 1.3 10*3/uL (ref 0.7–4.0)
MCH: 32.8 pg (ref 26.0–34.0)
MCHC: 32.1 g/dL (ref 30.0–36.0)
MCV: 101.9 fL — ABNORMAL HIGH (ref 80.0–100.0)
Monocytes Absolute: 0.4 10*3/uL (ref 0.1–1.0)
Monocytes Relative: 7 %
NRBC: 0 % (ref 0.0–0.2)
Neutro Abs: 4.3 10*3/uL (ref 1.7–7.7)
Neutrophils Relative %: 69 %
Platelets: 225 10*3/uL (ref 150–400)
RBC: 4.12 MIL/uL (ref 3.87–5.11)
RDW: 12.5 % (ref 11.5–15.5)
WBC: 6.1 10*3/uL (ref 4.0–10.5)

## 2018-01-20 LAB — BASIC METABOLIC PANEL
Anion gap: 9 (ref 5–15)
BUN: 21 mg/dL (ref 8–23)
CALCIUM: 9 mg/dL (ref 8.9–10.3)
CHLORIDE: 108 mmol/L (ref 98–111)
CO2: 26 mmol/L (ref 22–32)
Creatinine, Ser: 0.54 mg/dL (ref 0.44–1.00)
GFR calc Af Amer: >60 mL/min (ref >60)
GFR calc non Af Amer: >60 mL/min (ref >60)
Glucose, Bld: 100 mg/dL — ABNORMAL HIGH (ref 70–99)
Potassium: 3.9 mmol/L (ref 3.5–5.1)
SODIUM: 143 mmol/L (ref 135–145)

## 2018-01-20 NOTE — Discharge Instructions (Addendum)
Limit salt intake, especially through the holiday. Continue with medications as prescribed. Discontinue glucosamine until evaluated by your doctor. Return to ER for chest pain, vision changes, headache, worsening or concerning symptoms. Follow up with your provider.  Your blood work today is normal.

## 2018-01-20 NOTE — ED Triage Notes (Signed)
Pt here for hypertension. Pt reports feeling stressed 3 days ago, reports feeling dizzy. Pt took her her BP, which was 951 systolic. Pt then took her losartan, which lowered BP. Pt went to urgent care the next day, was told to take valium for stress and increase losartan. Pt states she woke up this morning feeling flushed and could hear blood flow in her ears. Pt then took her blood pressure, which was 170/90. Pt then took her losartan 30 minutes prior to arrival. Pt reports slight headache, denies dizziness.

## 2018-01-20 NOTE — ED Provider Notes (Signed)
[facility] DEPT Provider Note   CSN: [id number] Arrival date & time: 01/20/18  0370     History   Chief Complaint Chief Complaint  Patient presents with  . Hypertension    HPI Alejandra Hebert is a 67 y.o. female.  67 year old female presents with complaint of elevated blood pressure.  Patient states that her blood pressure has been high for the past 3 days, states when she checks her blood pressure at home it is 170s/90s.  Patient went to urgent care and was advised to take her valsartan twice daily as well as her diazepam as needed.  Patient reports increase in salt intake recently, increase in stress due to an argument with her daughter, also reports that she has been taking glucosamine for pain in her knee and tendinitis in her ankle and when she read the directions on the bottle it advised not to take medication if she was on blood pressure medication.  Patient states she woke up this morning feeling flushed in her face and knew that her blood pressure was elevated, took her blood pressure medication and blood pressure had improved on recheck in the ER, currently 144/86, currently feels well.  Patient denies headache, chest pain, changes in vision.  No other complaints or concerns today.     Past Medical History:  Diagnosis Date  . Abnormal Pap smear 09/08/2004   LGSIL/ colpo 10/18/2004  . Allergic rhinitis   . HTN (hypertension)   . Irritable bowel syndrome   . Menopause   . Vertigo     Patient Active Problem List   Diagnosis Date Noted  . HGSIL (high grade squamous intraepithelial dysplasia) 09/21/2011  . Vertigo   . Allergic rhinitis   . HTN (hypertension)     Past Surgical History:  Procedure Laterality Date  . LEEP  09/17/2002     OB History    Gravida  2   Para  2   Term  2   Preterm      AB      Living  2     SAB      TAB      Ectopic      Multiple      Live Births               Home Medications      Prior to Admission medications   Medication Sig Start Date End Date Taking? Authorizing Provider  ibuprofen (ADVIL,MOTRIN) 200 MG tablet Take 400 mg by mouth every 6 (six) hours as needed for headache or moderate pain.   Yes [provider]  losartan (COZAAR) 50 MG tablet Take 50 mg by mouth daily.    Yes [provider]    Family History No family history on file.  Social History Social History   Tobacco Use  . Smoking status: Current Some Day Smoker    Last attempt to quit: 03/31/2011    Years since quitting: 6.8  . Smokeless tobacco: Never Used  Substance Use Topics  . Alcohol use: No  . Drug use: No     Allergies   Latex; Erythromycin base; Etodolac; Mobic [meloxicam]; Erythromycin; and Sulfa antibiotics   Review of Systems Review of Systems  Constitutional: Negative for chills and fever.  Eyes: Negative for visual disturbance.  Cardiovascular: Negative for chest pain.  Skin: Negative for rash and wound.  Allergic/Immunologic: Negative for immunocompromised state.  Neurological: Negative for weakness and headaches.  Hematological: Does not bruise/bleed  easily.  Psychiatric/Behavioral: Negative for confusion.  All other systems reviewed and are negative.    Physical Exam Updated Vital Signs BP (!) 152/81 (BP Location: Right Arm)   Pulse 72   Temp 97.9 F (36.6 C) (Oral)   Resp 16   SpO2 97%   Physical Exam  Constitutional: She is oriented to person, place, and time. She appears well-developed and well-nourished. No distress.  HENT:  Head: Normocephalic and atraumatic.  Nose: Nose normal.  Mouth/Throat: Oropharynx is clear and moist. No oropharyngeal exudate.  Eyes: Pupils are equal, round, and reactive to light. EOM are normal.  Neck: Neck supple.  Cardiovascular: Normal rate, regular rhythm, normal heart sounds and intact distal pulses.  No murmur heard. Pulmonary/Chest: Effort normal and breath sounds normal. No respiratory  distress.  Neurological: She is alert and oriented to person, place, and time. No cranial nerve deficit or sensory deficit.  Skin: Skin is warm and dry. She is not diaphoretic.  Psychiatric: She has a normal mood and affect. Her behavior is normal.  Nursing note and vitals reviewed.    ED Treatments / Results  Labs (all labs ordered are listed, but only abnormal results are displayed) Labs Reviewed  BASIC METABOLIC PANEL - Abnormal; Notable for the following components:      Result Value   Glucose, Bld 100 (*)    All other components within normal limits  CBC WITH DIFFERENTIAL/PLATELET - Abnormal; Notable for the following components:   MCV 101.9 (*)    All other components within normal limits    EKG None  Radiology No results found.  Procedures Procedures (including critical care time)  Medications Ordered in ED Medications - No data to display   Initial Impression / Assessment and Plan / ED Course  I have reviewed the triage vital signs and the nursing notes.  Pertinent labs & imaging results that were available during my care of the patient were reviewed by me and considered in my medical decision making (see chart for details).  Clinical Course as of Jan 21 1219  Nancy Fetter Jan 21, 1848  [id number] 67 year old female presents with complaint of elevated blood pressure.  Patient's blood pressure may be elevated secondary to stress, and consult intake, taking glucosamine when she read the label specifically states do not take if given history of high blood pressure.  She took her blood pressure this morning was 170/90, she took her blood pressure medication and presented to the ER.  Patient's blood pressure has since improved and she does not have any symptoms at this time.  Patient has been taking Motrin more than usual for tendinitis, lab work ordered for further evaluation and shows normal kidney function.  Recommend patient continue with her current blood pressure medication regimen  and return to ER for worsening or concerning symptoms specifically headache, vision changes, chest pain otherwise she should follow-up with her primary care provider.  Recommend discontinuing the glucosamine in the meantime, limit her salt intake, work on Child psychotherapist.   [LM]    Clinical Course User Index [LM] Tacy Learn, PA-C   Final Clinical Impressions(s) / ED Diagnoses   Final diagnoses:  Hypertension, unspecified type    ED Discharge Orders    None       Roque Lias 01/20/18 1220    Lacretia Leigh, MD 01/21/18 2332

## 2018-01-30 ENCOUNTER — Encounter (HOSPITAL_BASED_OUTPATIENT_CLINIC_OR_DEPARTMENT_OTHER): Payer: Self-pay | Admitting: Emergency Medicine

## 2018-01-30 ENCOUNTER — Other Ambulatory Visit: Payer: Self-pay

## 2018-01-30 ENCOUNTER — Emergency Department (HOSPITAL_BASED_OUTPATIENT_CLINIC_OR_DEPARTMENT_OTHER)
Admission: EM | Admit: 2018-01-30 | Discharge: 2018-01-31 | Disposition: A | Discharge status: Home / Self Care | Payer: Medicare HMO | Attending: Emergency Medicine | Admitting: Emergency Medicine

## 2018-01-30 DIAGNOSIS — Z91040 Latex allergy status: Secondary | ICD-10-CM | POA: Diagnosis not present

## 2018-01-30 DIAGNOSIS — I10 Essential (primary) hypertension: Secondary | ICD-10-CM | POA: Diagnosis not present

## 2018-01-30 DIAGNOSIS — K1379 Other lesions of oral mucosa: Secondary | ICD-10-CM | POA: Diagnosis present

## 2018-01-30 DIAGNOSIS — M71571 Other bursitis, not elsewhere classified, right ankle and foot: Secondary | ICD-10-CM | POA: Diagnosis not present

## 2018-01-30 DIAGNOSIS — K120 Recurrent oral aphthae: Secondary | ICD-10-CM | POA: Diagnosis not present

## 2018-01-30 DIAGNOSIS — Z87891 Personal history of nicotine dependence: Secondary | ICD-10-CM | POA: Diagnosis not present

## 2018-01-30 DIAGNOSIS — Z79899 Other long term (current) drug therapy: Secondary | ICD-10-CM | POA: Diagnosis not present

## 2018-01-30 DIAGNOSIS — M7661 Achilles tendinitis, right leg: Secondary | ICD-10-CM | POA: Diagnosis not present

## 2018-01-30 NOTE — ED Triage Notes (Signed)
Pt is c/o sores in her mouth and to the back of her throat  Sxs started 3 days ago  Pt has been using orajel mouth wash for sores in the mouth  States tonight her blood pressure is elevated

## 2018-01-30 NOTE — ED Provider Notes (Signed)
[facility] DEPT [facility] Provider Note: Georgena Spurling, MD, FACEP  CSN: [id number] MRN: [id number] ARRIVAL: 01/30/18 at 2257 ROOM: [place]  Mouth Lesions   HISTORY OF PRESENT ILLNESS  01/30/18 11:57 PM Alejandra Hebert is a 67 y.o. female who ate pineapple about a week ago.  She has a history of getting oral ulcers when eating pineapple due to the acid.  She is here with ulcerations which she describes as "canker sores" on the underside of her tongue.  She also feels like something is swollen on the back of her tongue.  She has been using Orajel mouthwash for the sores without adequate relief.  She is her symptoms of made her very anxious and she has noticed her blood pressure has been up.  Discomfort is mild to moderate.   Past Medical History:  Diagnosis Date  . Abnormal Pap smear 09/08/2004   LGSIL/ colpo 10/18/2004  . Allergic rhinitis   . HTN (hypertension)   . Irritable bowel syndrome   . Menopause   . Vertigo     Past Surgical History:  Procedure Laterality Date  . LEEP  09/17/2002    History reviewed. No pertinent family history.  Social History   Tobacco Use  . Smoking status: Former Smoker    Last attempt to quit: 03/31/2011    Years since quitting: 6.8  . Smokeless tobacco: Never Used  Substance Use Topics  . Alcohol use: No  . Drug use: No    Prior to Admission medications   Medication Sig Start Date End Date Taking? Authorizing Provider  ibuprofen (ADVIL,MOTRIN) 200 MG tablet Take 400 mg by mouth every 6 (six) hours as needed for headache or moderate pain.    [provider]  losartan (COZAAR) 50 MG tablet Take 50 mg by mouth daily.     [provider]  triamcinolone (KENALOG) 0.1 % paste Apply to mouth sores twice daily. 01/31/18   Analysia Dungee, Jenny Reichmann, MD    Allergies Latex; Erythromycin base; Etodolac; Mobic [meloxicam]; Erythromycin; and Sulfa antibiotics   REVIEW OF SYSTEMS  Negative except as noted here or in the  History of Present Illness.   PHYSICAL EXAMINATION  Initial Vital Signs Blood pressure (!) 173/103, pulse 87, temperature 98.3 F (36.8 C), temperature source Oral, resp. rate 18, height 5' 5.5" (1.664 m), weight 90.7 kg, SpO2 95 %.  Examination General: Well-developed, well-nourished female in no acute distress; appearance consistent with age of record HENT: normocephalic; atraumatic; aphthous ulcerations of underside of tongue; no ulcerations seen in pharynx or palate; prominent papillae at base of tongue may represent enlarged lingual tonsils Eyes: pupils equal, round and reactive to light; extraocular muscles intact Neck: supple; no lymphadenopathy Heart: regular rate and rhythm Lungs: clear to auscultation bilaterally Abdomen: soft; nondistended; nontender; bowel sounds present Extremities: No deformity; full range of motion Neurologic: Awake, alert and oriented; motor function intact in all extremities and symmetric; no facial droop Skin: Warm and dry Psychiatric: Anxious   RESULTS  Summary of this visit's results, reviewed by myself:   EKG Interpretation  Date/Time:    Ventricular Rate:    PR Interval:    QRS Duration:   QT Interval:    QTC Calculation:   R Axis:     Text Interpretation:        Laboratory Studies: No results found for this or any previous visit (from the past 24 hour(s)). Imaging Studies: No results found.  ED COURSE and MDM  Nursing notes  and initial vitals signs, including pulse oximetry, reviewed.  Vitals:   01/30/18 2324 01/30/18 2325 01/31/18 0016  BP: (!) 173/103  (!) 148/81  Pulse: 87  80  Resp: 18  18  Temp: 98.3 F (36.8 C)  98.2 F (36.8 C)  TempSrc: Oral  Oral  SpO2: 95%  100%  Weight:  90.7 kg   Height:  5' 5.5" (1.664 m)    We will treat aphthous ulcers with Kenalog in [place].  PROCEDURES    ED DIAGNOSES     ICD-10-CM   1. Aphthous ulcer of tongue K12.0        Alazay Leicht, MD 01/31/18 [phone]

## 2018-01-31 MED ORDER — TRIAMCINOLONE ACETONIDE 0.1 % MT PSTE: PASTE | OROMUCOSAL | 1 refills | Status: AC

## 2018-02-07 DIAGNOSIS — M25569 Pain in unspecified knee: Secondary | ICD-10-CM | POA: Diagnosis not present

## 2018-02-07 DIAGNOSIS — H9319 Tinnitus, unspecified ear: Secondary | ICD-10-CM | POA: Diagnosis not present

## 2018-02-07 DIAGNOSIS — M7660 Achilles tendinitis, unspecified leg: Secondary | ICD-10-CM | POA: Diagnosis not present

## 2018-02-07 DIAGNOSIS — R69 Illness, unspecified: Secondary | ICD-10-CM | POA: Diagnosis not present

## 2018-02-07 DIAGNOSIS — I10 Essential (primary) hypertension: Secondary | ICD-10-CM | POA: Diagnosis not present

## 2018-02-12 DIAGNOSIS — M1711 Unilateral primary osteoarthritis, right knee: Secondary | ICD-10-CM | POA: Diagnosis not present

## 2018-02-12 DIAGNOSIS — M7661 Achilles tendinitis, right leg: Secondary | ICD-10-CM | POA: Diagnosis not present

## 2018-02-12 DIAGNOSIS — M1712 Unilateral primary osteoarthritis, left knee: Secondary | ICD-10-CM | POA: Diagnosis not present

## 2018-02-12 DIAGNOSIS — M25561 Pain in right knee: Secondary | ICD-10-CM | POA: Diagnosis not present

## 2018-02-18 DIAGNOSIS — S86011A Strain of right Achilles tendon, initial encounter: Secondary | ICD-10-CM | POA: Diagnosis not present

## 2018-03-05 DIAGNOSIS — I10 Essential (primary) hypertension: Secondary | ICD-10-CM | POA: Diagnosis not present

## 2018-03-20 DIAGNOSIS — M7661 Achilles tendinitis, right leg: Secondary | ICD-10-CM | POA: Diagnosis not present

## 2018-04-16 DIAGNOSIS — M1711 Unilateral primary osteoarthritis, right knee: Secondary | ICD-10-CM | POA: Diagnosis not present

## 2018-04-16 DIAGNOSIS — M25571 Pain in right ankle and joints of right foot: Secondary | ICD-10-CM | POA: Diagnosis not present

## 2018-04-23 DIAGNOSIS — M71571 Other bursitis, not elsewhere classified, right ankle and foot: Secondary | ICD-10-CM | POA: Diagnosis not present

## 2018-04-23 DIAGNOSIS — M7661 Achilles tendinitis, right leg: Secondary | ICD-10-CM | POA: Diagnosis not present

## 2018-05-03 DIAGNOSIS — Z79899 Other long term (current) drug therapy: Secondary | ICD-10-CM | POA: Diagnosis not present

## 2018-05-03 DIAGNOSIS — M7661 Achilles tendinitis, right leg: Secondary | ICD-10-CM | POA: Diagnosis not present

## 2018-05-03 DIAGNOSIS — M24571 Contracture, right ankle: Secondary | ICD-10-CM | POA: Diagnosis not present

## 2018-05-03 DIAGNOSIS — Z01812 Encounter for preprocedural laboratory examination: Secondary | ICD-10-CM | POA: Diagnosis not present

## 2018-12-12 DIAGNOSIS — H43813 Vitreous degeneration, bilateral: Secondary | ICD-10-CM | POA: Diagnosis not present

## 2018-12-12 DIAGNOSIS — D3132 Benign neoplasm of left choroid: Secondary | ICD-10-CM | POA: Diagnosis not present

## 2018-12-16 DIAGNOSIS — M7918 Myalgia, other site: Secondary | ICD-10-CM | POA: Diagnosis not present

## 2018-12-16 DIAGNOSIS — R1011 Right upper quadrant pain: Secondary | ICD-10-CM | POA: Diagnosis not present

## 2018-12-17 DIAGNOSIS — R69 Illness, unspecified: Secondary | ICD-10-CM | POA: Diagnosis not present

## 2018-12-17 DIAGNOSIS — B372 Candidiasis of skin and nail: Secondary | ICD-10-CM | POA: Diagnosis not present

## 2018-12-17 DIAGNOSIS — R7309 Other abnormal glucose: Secondary | ICD-10-CM | POA: Diagnosis not present

## 2019-03-06 DIAGNOSIS — M25562 Pain in left knee: Secondary | ICD-10-CM | POA: Diagnosis not present

## 2019-03-06 DIAGNOSIS — M25561 Pain in right knee: Secondary | ICD-10-CM | POA: Diagnosis not present

## 2019-03-20 DIAGNOSIS — R7303 Prediabetes: Secondary | ICD-10-CM | POA: Diagnosis not present

## 2019-03-20 DIAGNOSIS — R69 Illness, unspecified: Secondary | ICD-10-CM | POA: Diagnosis not present

## 2019-03-20 DIAGNOSIS — I10 Essential (primary) hypertension: Secondary | ICD-10-CM | POA: Diagnosis not present

## 2019-03-20 DIAGNOSIS — E669 Obesity, unspecified: Secondary | ICD-10-CM | POA: Diagnosis not present

## 2019-03-24 ENCOUNTER — Telehealth: Payer: Self-pay | Admitting: Family Medicine

## 2019-03-24 NOTE — Telephone Encounter (Signed)
Patient calls in wanting to know if she could see Inda Coke for nutrition advice and about eating healthier. She has a physician at [facility] and she says they recommended [place],

## 2019-03-24 NOTE — Telephone Encounter (Signed)
Alejandra Hebert said she does not do Nutrition visits outside of [facility]. [facility] patients only.

## 2019-04-14 DIAGNOSIS — H6980 Other specified disorders of Eustachian tube, unspecified ear: Secondary | ICD-10-CM | POA: Diagnosis not present

## 2019-04-14 DIAGNOSIS — H6123 Impacted cerumen, bilateral: Secondary | ICD-10-CM | POA: Diagnosis not present

## 2019-04-25 ENCOUNTER — Encounter: Payer: Medicare HMO | Attending: Family Medicine | Admitting: Registered"

## 2019-04-25 DIAGNOSIS — R7303 Prediabetes: Secondary | ICD-10-CM | POA: Insufficient documentation

## 2019-04-28 ENCOUNTER — Encounter: Payer: Self-pay | Admitting: Registered"

## 2019-04-28 NOTE — Progress Notes (Signed)
Patient completed Core Session 1 of Diabetes Prevention Program course virtually with [facility]. The following learning objectives were met by the patient during this class:   Learning Objectives:   Be able to explain the purpose and benefits of the National Diabetes Prevention Program.   Be able to describe the events that will take place at every session.   Know the weight loss and physical activity goals established by the [facility] Diabetes Prevention Program.   Know their own individual weight loss and physical activity goals.   Be able to explain the important effect of self-monitoring on behavior change.   Goals:  . Record food and beverage intake in "Food and Activity Tracker" over the next week.  . E-mail completed "Food and Activity Tracker" to Lifestyle Coach next week before session 2. . Circle the foods or beverages you think are highest in fat and calories in your food tracker. . Read the labels on the food you buy, and consider using measuring cups and spoons to help you calculate the amount you eat. We will talk about measuring in more detail in the coming weeks.   Follow-Up Plan:  Attend Core Session 2 next week.   E-mail completed "Food and Activity Tracker" to Lifestyle Coach next week before class

## 2019-05-02 ENCOUNTER — Encounter: Payer: Medicare HMO | Attending: Family Medicine

## 2019-05-02 DIAGNOSIS — R7303 Prediabetes: Secondary | ICD-10-CM | POA: Insufficient documentation

## 2019-05-11 DIAGNOSIS — R1013 Epigastric pain: Secondary | ICD-10-CM | POA: Diagnosis not present

## 2019-05-11 DIAGNOSIS — R8290 Unspecified abnormal findings in urine: Secondary | ICD-10-CM | POA: Diagnosis not present

## 2019-05-11 DIAGNOSIS — M545 Low back pain: Secondary | ICD-10-CM | POA: Diagnosis not present

## 2019-05-11 DIAGNOSIS — K30 Functional dyspepsia: Secondary | ICD-10-CM | POA: Diagnosis not present

## 2019-05-16 DIAGNOSIS — R1013 Epigastric pain: Secondary | ICD-10-CM | POA: Diagnosis not present

## 2019-05-16 DIAGNOSIS — R8290 Unspecified abnormal findings in urine: Secondary | ICD-10-CM | POA: Diagnosis not present

## 2019-05-16 DIAGNOSIS — Z6831 Body mass index (BMI) 31.0-31.9, adult: Secondary | ICD-10-CM | POA: Diagnosis not present

## 2019-05-16 DIAGNOSIS — R7303 Prediabetes: Secondary | ICD-10-CM | POA: Diagnosis not present

## 2019-05-16 DIAGNOSIS — E669 Obesity, unspecified: Secondary | ICD-10-CM | POA: Diagnosis not present

## 2019-05-16 DIAGNOSIS — I10 Essential (primary) hypertension: Secondary | ICD-10-CM | POA: Diagnosis not present

## 2019-06-16 DIAGNOSIS — L989 Disorder of the skin and subcutaneous tissue, unspecified: Secondary | ICD-10-CM | POA: Diagnosis not present

## 2019-06-16 DIAGNOSIS — L821 Other seborrheic keratosis: Secondary | ICD-10-CM | POA: Diagnosis not present

## 2019-06-24 DIAGNOSIS — L821 Other seborrheic keratosis: Secondary | ICD-10-CM | POA: Diagnosis not present

## 2019-06-27 ENCOUNTER — Encounter: Payer: Medicare HMO | Admitting: Registered"

## 2019-08-19 DIAGNOSIS — Z1231 Encounter for screening mammogram for malignant neoplasm of breast: Secondary | ICD-10-CM | POA: Diagnosis not present

## 2019-08-19 DIAGNOSIS — Z01419 Encounter for gynecological examination (general) (routine) without abnormal findings: Secondary | ICD-10-CM | POA: Diagnosis not present

## 2019-08-19 DIAGNOSIS — Z124 Encounter for screening for malignant neoplasm of cervix: Secondary | ICD-10-CM | POA: Diagnosis not present

## 2019-09-09 DIAGNOSIS — W541XXA Struck by dog, initial encounter: Secondary | ICD-10-CM | POA: Diagnosis not present

## 2019-09-09 DIAGNOSIS — S8011XA Contusion of right lower leg, initial encounter: Secondary | ICD-10-CM | POA: Diagnosis not present

## 2019-09-09 DIAGNOSIS — S8012XA Contusion of left lower leg, initial encounter: Secondary | ICD-10-CM | POA: Diagnosis not present

## 2019-12-12 DIAGNOSIS — L304 Erythema intertrigo: Secondary | ICD-10-CM | POA: Diagnosis not present

## 2019-12-18 DIAGNOSIS — D3132 Benign neoplasm of left choroid: Secondary | ICD-10-CM | POA: Diagnosis not present

## 2019-12-18 DIAGNOSIS — H43813 Vitreous degeneration, bilateral: Secondary | ICD-10-CM | POA: Diagnosis not present

## 2019-12-22 DIAGNOSIS — Z03818 Encounter for observation for suspected exposure to other biological agents ruled out: Secondary | ICD-10-CM | POA: Diagnosis not present

## 2019-12-22 DIAGNOSIS — J22 Unspecified acute lower respiratory infection: Secondary | ICD-10-CM | POA: Diagnosis not present

## 2019-12-25 DIAGNOSIS — H6123 Impacted cerumen, bilateral: Secondary | ICD-10-CM | POA: Diagnosis not present

## 2020-01-15 DIAGNOSIS — R69 Illness, unspecified: Secondary | ICD-10-CM | POA: Diagnosis not present

## 2020-01-15 DIAGNOSIS — R7303 Prediabetes: Secondary | ICD-10-CM | POA: Diagnosis not present

## 2020-01-15 DIAGNOSIS — I10 Essential (primary) hypertension: Secondary | ICD-10-CM | POA: Diagnosis not present

## 2020-02-05 DIAGNOSIS — B079 Viral wart, unspecified: Secondary | ICD-10-CM | POA: Diagnosis not present

## 2020-02-05 DIAGNOSIS — R21 Rash and other nonspecific skin eruption: Secondary | ICD-10-CM | POA: Diagnosis not present

## 2020-02-19 DIAGNOSIS — B079 Viral wart, unspecified: Secondary | ICD-10-CM | POA: Diagnosis not present

## 2020-03-08 DIAGNOSIS — B079 Viral wart, unspecified: Secondary | ICD-10-CM | POA: Diagnosis not present

## 2020-06-15 DIAGNOSIS — J302 Other seasonal allergic rhinitis: Secondary | ICD-10-CM | POA: Diagnosis not present

## 2020-06-15 DIAGNOSIS — H1013 Acute atopic conjunctivitis, bilateral: Secondary | ICD-10-CM | POA: Diagnosis not present

## 2020-06-15 DIAGNOSIS — J0100 Acute maxillary sinusitis, unspecified: Secondary | ICD-10-CM | POA: Diagnosis not present

## 2020-11-18 ENCOUNTER — Encounter (HOSPITAL_BASED_OUTPATIENT_CLINIC_OR_DEPARTMENT_OTHER): Payer: Self-pay

## 2020-11-18 ENCOUNTER — Other Ambulatory Visit: Payer: Self-pay

## 2020-11-18 ENCOUNTER — Emergency Department (HOSPITAL_BASED_OUTPATIENT_CLINIC_OR_DEPARTMENT_OTHER)
Admission: EM | Admit: 2020-11-18 | Discharge: 2020-11-18 | Disposition: A | Discharge status: Home / Self Care | Payer: Medicare HMO | Attending: Emergency Medicine | Admitting: Emergency Medicine

## 2020-11-18 ENCOUNTER — Emergency Department (HOSPITAL_BASED_OUTPATIENT_CLINIC_OR_DEPARTMENT_OTHER): Payer: Medicare HMO

## 2020-11-18 DIAGNOSIS — Z87891 Personal history of nicotine dependence: Secondary | ICD-10-CM | POA: Insufficient documentation

## 2020-11-18 DIAGNOSIS — Z91040 Latex allergy status: Secondary | ICD-10-CM | POA: Diagnosis not present

## 2020-11-18 DIAGNOSIS — Z79899 Other long term (current) drug therapy: Secondary | ICD-10-CM | POA: Insufficient documentation

## 2020-11-18 DIAGNOSIS — I10 Essential (primary) hypertension: Secondary | ICD-10-CM | POA: Insufficient documentation

## 2020-11-18 DIAGNOSIS — M545 Low back pain, unspecified: Secondary | ICD-10-CM | POA: Insufficient documentation

## 2020-11-18 DIAGNOSIS — T148XXA Other injury of unspecified body region, initial encounter: Secondary | ICD-10-CM

## 2020-11-18 DIAGNOSIS — S39012A Strain of muscle, fascia and tendon of lower back, initial encounter: Secondary | ICD-10-CM | POA: Diagnosis not present

## 2020-11-18 DIAGNOSIS — M546 Pain in thoracic spine: Secondary | ICD-10-CM | POA: Diagnosis not present

## 2020-11-18 LAB — URINALYSIS, MICROSCOPIC (REFLEX)

## 2020-11-18 LAB — BASIC METABOLIC PANEL
Anion gap: 9 (ref 5–15)
BUN: 21 mg/dL (ref 8–23)
CO2: 25 mmol/L (ref 22–32)
Calcium: 8.9 mg/dL (ref 8.9–10.3)
Chloride: 106 mmol/L (ref 98–111)
Creatinine, Ser: 0.63 mg/dL (ref 0.44–1.00)
GFR, Estimated: >60 mL/min (ref >60)
Glucose, Bld: 93 mg/dL (ref 70–99)
Potassium: 4.1 mmol/L (ref 3.5–5.1)
Sodium: 140 mmol/L (ref 135–145)

## 2020-11-18 LAB — CBC WITH DIFFERENTIAL/PLATELET
Abs Immature Granulocytes: 0.02 10*3/uL (ref 0.00–0.07)
Basophils Absolute: 0 10*3/uL (ref 0.0–0.1)
Basophils Relative: 0 %
Eosinophils Absolute: 0 10*3/uL (ref 0.0–0.5)
Eosinophils Relative: 1 %
HCT: 38.3 % (ref 36.0–46.0)
Hemoglobin: 13 g/dL (ref 12.0–15.0)
Immature Granulocytes: 0 %
Lymphocytes Relative: 25 %
Lymphs Abs: 1.5 10*3/uL (ref 0.7–4.0)
MCH: 32.3 pg (ref 26.0–34.0)
MCHC: 33.9 g/dL (ref 30.0–36.0)
MCV: 95.3 fL (ref 80.0–100.0)
Monocytes Absolute: 0.4 10*3/uL (ref 0.1–1.0)
Monocytes Relative: 8 %
Neutro Abs: 3.8 10*3/uL (ref 1.7–7.7)
Neutrophils Relative %: 66 %
Platelets: 234 10*3/uL (ref 150–400)
RBC: 4.02 MIL/uL (ref 3.87–5.11)
RDW: 12.6 % (ref 11.5–15.5)
WBC: 5.8 10*3/uL (ref 4.0–10.5)
nRBC: 0 % (ref 0.0–0.2)

## 2020-11-18 LAB — URINALYSIS, ROUTINE W REFLEX MICROSCOPIC
Bilirubin Urine: NEGATIVE
Glucose, UA: NEGATIVE mg/dL
Hgb urine dipstick: NEGATIVE
Ketones, ur: NEGATIVE mg/dL
Nitrite: NEGATIVE
Protein, ur: NEGATIVE mg/dL
Specific Gravity, Urine: 1.025 (ref 1.005–1.030)
pH: 6 (ref 5.0–8.0)

## 2020-11-18 MED ORDER — LIDOCAINE 5 % EX PTCH
1.0000 | MEDICATED_PATCH | CUTANEOUS | Status: DC
  Administered 2020-11-18: 1 via TRANSDERMAL
  Filled 2020-11-18: qty 1

## 2020-11-18 MED ORDER — METHOCARBAMOL 500 MG PO TABS: 500.0000 mg | ORAL_TABLET | Freq: Three times a day (TID) | ORAL | 0 refills | Status: AC | PRN

## 2020-11-18 NOTE — ED Triage Notes (Signed)
Pt c/o pain to back "all over the place" x 10 days also c/o dizziness when she lies down or gets up-reports hx of "inner ear"-NAD-steady gait

## 2020-11-18 NOTE — ED Provider Notes (Addendum)
[facility] Provider Note   CSN: [id number] Arrival date & time: 11/18/20  1302     History Chief Complaint  Patient presents with  . Back Pain    Alejandra Hebert is a 70 y.o. female.    Patient presents with upper and lower back pain x10 days.  This started acutely when she lifted a flower pot, its been constant since then.  Is worsened by movement, constant throughout the day.  The pain moves and is not well localized.  She has tried 2 doses of ibuprofen with minimal relief.  Denies any fevers, no previous back surgeries.  No urinary retention, bilateral leg numbness, history of IV drug use.    Patient is also concerned about blood in the urine that happened a year and a half ago, she was too busy to follow-up on it.  She denies any blood in the urine in the last year and a half.  No dysuria, hematuria, abdominal pain.  Past Medical History:  Diagnosis Date  . Abnormal Pap smear 09/08/2004   LGSIL/ colpo 10/18/2004  . Allergic rhinitis   . HTN (hypertension)   . Irritable bowel syndrome   . Menopause   . Vertigo     Patient Active Problem List   Diagnosis Date Noted  . HGSIL (high grade squamous intraepithelial dysplasia) 09/21/2011  . Vertigo   . Allergic rhinitis   . HTN (hypertension)     Past Surgical History:  Procedure Laterality Date  . LEEP  09/17/2002     OB History     Gravida  2   Para  2   Term  2   Preterm      AB      Living  2      SAB      IAB      Ectopic      Multiple      Live Births              No family history on file.  Social History   Tobacco Use  . Smoking status: Former    Types: Cigarettes    Quit date: 03/31/2011    Years since quitting: 9.6  . Smokeless tobacco: Never  Vaping Use  . Vaping Use: Never used  Substance Use Topics  . Alcohol use: No  . Drug use: No    Home Medications Prior to Admission medications   Medication Sig Start Date End Date Taking?  Authorizing Provider  ibuprofen (ADVIL,MOTRIN) 200 MG tablet Take 400 mg by mouth every 6 (six) hours as needed for headache or moderate pain.    [provider]  losartan (COZAAR) 50 MG tablet Take 50 mg by mouth daily.     [provider]  triamcinolone (KENALOG) 0.1 % paste Apply to mouth sores twice daily. 01/31/18   Molpus, John, MD    Allergies    Latex, Erythromycin base, Etodolac, Mobic [meloxicam], Erythromycin, and Sulfa antibiotics  Review of Systems   Review of Systems  Constitutional:  Negative for fatigue and fever.  Respiratory:  Negative for shortness of breath.   Cardiovascular:  Negative for chest pain and leg swelling.  Gastrointestinal:  Negative for abdominal pain, nausea and vomiting.  Genitourinary:  Negative for dysuria.  Musculoskeletal:  Positive for back pain. Negative for neck pain.  Skin:  Negative for color change and wound.  Neurological:  Negative for weakness and numbness.   Physical Exam Updated Vital Signs BP  131/73 (BP Location: Left Arm)   Pulse 90   Temp 99.1 F (37.3 C) (Oral)   Resp 20   Ht 5\' 5"  (1.651 m)   SpO2 96%   BMI 33.28 kg/m   Physical Exam Vitals and nursing note reviewed. Exam conducted with a chaperone present.  Constitutional:      Appearance: Normal appearance.     Comments: Patient is ambulatory, stable without walking assistance.  Does not need a walker or cane.  HENT:     Head: Normocephalic and atraumatic.  Eyes:     General: No scleral icterus.       Right eye: No discharge.        Left eye: No discharge.     Extraocular Movements: Extraocular movements intact.     Pupils: Pupils are equal, round, and reactive to light.  Cardiovascular:     Rate and Rhythm: Normal rate and regular rhythm.     Pulses: Normal pulses.     Heart sounds: Normal heart sounds. No murmur heard.   No friction rub. No gallop.  Pulmonary:     Effort: Pulmonary effort is normal. No respiratory distress.     Breath  sounds: Normal breath sounds.  Abdominal:     General: Abdomen is flat. Bowel sounds are normal. There is no distension.     Palpations: Abdomen is soft.     Tenderness: There is no abdominal tenderness.     Comments: Abdomen is soft, nontender.  Musculoskeletal:     Comments: Diffuse back pain, not well localized.  Not midline, more paraspinal.  Upper and lower back.  No point tenderness or crepitus.  Skin:    General: Skin is warm and dry.     Coloration: Skin is not jaundiced.  Neurological:     Mental Status: She is alert. Mental status is at baseline.     Coordination: Coordination normal.     Comments: Grip strength is equal bilaterally.  Patient is ambulatory without difficulty.  Sensation to light touch grossly intact to the lower extremities.  Cranial nerves III through XII grossly intact    ED Results / Procedures / Treatments   Labs (all labs ordered are listed, but only abnormal results are displayed) Labs Reviewed - No data to display  EKG None  Radiology No results found.  Procedures Procedures   Medications Ordered in ED Medications - No data to display  ED Course  I have reviewed the triage vital signs and the nursing notes.  Pertinent labs & imaging results that were available during my care of the patient were reviewed by me and considered in my medical decision making (see chart for details).    MDM Rules/Calculators/A&P                           Patient vitals are stable, she is neurologically intact.  The mechanism was lifting a pot, relatively atraumatic.  We will check radiographs to reassure the patient.  She also complains of blood in the urine a year and a half ago, none recently.  Will check urine but very low suspicion for kidney stone as a cause of her back pain.  Patient is afebrile, not tachycardic or hypotensive.  She is ambulatory without assistance.  Doubt cauda equina, doubt epidural abscess  UA negative for signs of infection.  No  hemoglobin concerning for possible kidney infection.  Creatinine shows well performing kidneys.  No leukocytosis concerning for underlying  infection.  Overall, patient is stable.  Doubt any emergent pathology at this time.  She is appropriate for discharge.  Discussed HPI, physical exam and plan of care for this patient with attending Madalyn Rob. The attending physician evaluated this patient as part of a shared visit and agrees with plan of care.   Final Clinical Impression(s) / ED Diagnoses Final diagnoses:  None    Rx / DC Orders ED Discharge Orders     None        Sherrill Raring, PA-C 11/18/20 1450    Sherrill Raring, [place] 11/18/20 1722    Lucrezia Starch, MD 11/22/20 [phone]

## 2020-11-18 NOTE — Discharge Instructions (Addendum)
Take Tylenol as needed for pain.  Take motrin as needed for pain.  Take it with food and water.  May also use Lidoderm patches with a helped in the ED setting.  If they do not help you do not need to pick them up.  Please follow-up with your primary care doctor for additional evaluation as needed.  Additionally you have many general health concerns that should be addressed in followed up with.  Your work-up today did not show any signs of blood in the urine, no platelets dysfunction that would be causing any abnormal bruising.  Overall, work-up very reassuring.  Return to the ED as needed if things worsen

## 2020-12-13 DIAGNOSIS — Z01419 Encounter for gynecological examination (general) (routine) without abnormal findings: Secondary | ICD-10-CM | POA: Diagnosis not present

## 2020-12-13 DIAGNOSIS — Z1231 Encounter for screening mammogram for malignant neoplasm of breast: Secondary | ICD-10-CM | POA: Diagnosis not present

## 2020-12-13 DIAGNOSIS — Z859 Personal history of malignant neoplasm, unspecified: Secondary | ICD-10-CM | POA: Diagnosis not present

## 2020-12-23 DIAGNOSIS — H43813 Vitreous degeneration, bilateral: Secondary | ICD-10-CM | POA: Diagnosis not present

## 2020-12-23 DIAGNOSIS — D3132 Benign neoplasm of left choroid: Secondary | ICD-10-CM | POA: Diagnosis not present

## 2021-02-11 ENCOUNTER — Other Ambulatory Visit: Payer: Self-pay

## 2021-02-11 ENCOUNTER — Encounter (HOSPITAL_BASED_OUTPATIENT_CLINIC_OR_DEPARTMENT_OTHER): Payer: Self-pay | Admitting: *Deleted

## 2021-02-11 ENCOUNTER — Emergency Department (HOSPITAL_BASED_OUTPATIENT_CLINIC_OR_DEPARTMENT_OTHER)
Admission: EM | Admit: 2021-02-11 | Discharge: 2021-02-11 | Disposition: A | Discharge status: Home / Self Care | Payer: Medicare HMO | Attending: Emergency Medicine | Admitting: Emergency Medicine

## 2021-02-11 DIAGNOSIS — I10 Essential (primary) hypertension: Secondary | ICD-10-CM | POA: Diagnosis not present

## 2021-02-11 DIAGNOSIS — H6123 Impacted cerumen, bilateral: Secondary | ICD-10-CM | POA: Insufficient documentation

## 2021-02-11 DIAGNOSIS — R42 Dizziness and giddiness: Secondary | ICD-10-CM | POA: Diagnosis not present

## 2021-02-11 DIAGNOSIS — Z87891 Personal history of nicotine dependence: Secondary | ICD-10-CM | POA: Diagnosis not present

## 2021-02-11 DIAGNOSIS — Z91040 Latex allergy status: Secondary | ICD-10-CM | POA: Diagnosis not present

## 2021-02-11 DIAGNOSIS — Z79899 Other long term (current) drug therapy: Secondary | ICD-10-CM | POA: Diagnosis not present

## 2021-02-11 DIAGNOSIS — H9203 Otalgia, bilateral: Secondary | ICD-10-CM | POA: Diagnosis not present

## 2021-02-11 NOTE — Discharge Instructions (Addendum)
You came to the emergency department today to be evaluated for your bilateral ear fullness and dizziness.  Your exam showed that you had earwax impaction to both ears.  Irrigation was performed and the earwax impactions were removed.  Get help right away if: You vomit or have diarrhea and are unable to eat or drink anything. You have problems talking, walking, swallowing, or using your arms, hands, or legs. You feel generally weak. You have any bleeding. You are not thinking clearly or you have trouble forming sentences. It may take a friend or family member to notice this. You have chest pain, abdominal pain, shortness of breath, or sweating. Your vision changes or you develop a severe headache. Your hearing is not improving or is getting worse. You have pain or redness in your ear. You are dizzy. You have ringing in your ears. You have nausea or vomiting. You have fluid, blood, or pus coming out of your ear.

## 2021-02-11 NOTE — ED Provider Notes (Signed)
[facility] Provider Note   CSN: [id number] Arrival date & time: 02/11/21  2200     History Chief Complaint  Patient presents with   Ear Fullness    Alejandra Hebert is a 70 y.o. female with a history of vertigo, hypertension.  Patient presents to the emergency department with a chief complaint of bilateral ear fullness.  States that the symptoms have been present over the last 4 days.  States that she has had episodes of dizziness with change in position over this time as well.  States that dizziness only occurs when moving from laying to sitting or standing position.  Dizziness lasts for 6 to 7 seconds before it resolves.  Patient states that she has had similar symptoms in the past when she has had cerumen impactions.  Patient has tried cleaning out her ears with no success.  No other modalities have been attempted to alleviate her symptoms.    Patient denies any recent falls or injuries.  Denies any numbness, weakness, facial asymmetry, dysarthria, aphasia, visual disturbance, unsteady gait, fever, chills, neck pain, neck stiffness.       Ear Fullness Pertinent negatives include no chest pain, no abdominal pain, no headaches and no shortness of breath.      Past Medical History:  Diagnosis Date   Abnormal Pap smear 09/08/2004   LGSIL/ colpo 10/18/2004   Allergic rhinitis    HTN (hypertension)    Irritable bowel syndrome    Menopause    Vertigo     Patient Active Problem List   Diagnosis Date Noted   HGSIL (high grade squamous intraepithelial dysplasia) 09/21/2011   Vertigo    Allergic rhinitis    HTN (hypertension)     Past Surgical History:  Procedure Laterality Date   LEEP  09/17/2002     OB History     Gravida  2   Para  2   Term  2   Preterm      AB      Living  2      SAB      IAB      Ectopic      Multiple      Live Births              No family history on file.  Social History   Tobacco Use    Smoking status: Former    Types: Cigarettes    Quit date: 03/31/2011    Years since quitting: 9.8   Smokeless tobacco: Never  Vaping Use   Vaping Use: Never used  Substance Use Topics   Alcohol use: No   Drug use: No    Home Medications Prior to Admission medications   Medication Sig Start Date End Date Taking? Authorizing Provider  ibuprofen (ADVIL,MOTRIN) 200 MG tablet Take 400 mg by mouth every 6 (six) hours as needed for headache or moderate pain.    [provider]  losartan (COZAAR) 50 MG tablet Take 50 mg by mouth daily.     [provider]  methocarbamol (ROBAXIN) 500 MG tablet Take 1 tablet (500 mg total) by mouth every 8 (eight) hours as needed for muscle spasms. 11/18/20   Lucrezia Starch, MD  triamcinolone (KENALOG) 0.1 % paste Apply to mouth sores twice daily. 01/31/18   Molpus, John, MD    Allergies    Latex, Erythromycin base, Etodolac, Mobic [meloxicam], Erythromycin, and Sulfa antibiotics  Review of Systems   Review of Systems  Constitutional:  Negative for chills and fever.  HENT:  Negative for ear discharge, ear pain and facial swelling.   Eyes:  Negative for visual disturbance.  Respiratory:  Negative for shortness of breath.   Cardiovascular:  Negative for chest pain.  Gastrointestinal:  Negative for abdominal pain, nausea and vomiting.  Musculoskeletal:  Negative for back pain and neck pain.  Skin:  Negative for color change and rash.  Neurological:  Positive for dizziness. Negative for tremors, seizures, syncope, facial asymmetry, speech difficulty, weakness, light-headedness, numbness and headaches.  Psychiatric/Behavioral:  Negative for confusion.    Physical Exam Updated Vital Signs BP 140/86 (BP Location: Left Arm)    Pulse 73    Temp 98.4 F (36.9 C) (Oral)    Resp 18    Ht 5\' 6"  (1.676 m)    Wt 82.1 kg    SpO2 95%    BMI 29.21 kg/m   Physical Exam Vitals and nursing note reviewed.  Constitutional:      General: She is not  in acute distress.    Appearance: She is not ill-appearing, toxic-appearing or diaphoretic.  HENT:     Head: Normocephalic and atraumatic. No raccoon eyes, Battle's sign, abrasion, contusion, right periorbital erythema, left periorbital erythema or laceration.     Jaw: No trismus, swelling, pain on movement or malocclusion.     Right Ear: External ear normal. No laceration, drainage, swelling or tenderness. There is impacted cerumen. No mastoid tenderness.     Left Ear: External ear normal. No laceration, drainage, swelling or tenderness. There is impacted cerumen. No mastoid tenderness.     Ears:     Comments: No auricle proptosis bilaterally.  Patient has bilateral cerumen impaction.  Unable to visualize TMs bilaterally due to cerumen impaction. Eyes:     General: No scleral icterus.       Right eye: No discharge.        Left eye: No discharge.     Extraocular Movements: Extraocular movements intact.     Conjunctiva/sclera: Conjunctivae normal.     Pupils: Pupils are equal, round, and reactive to light.  Neck:     Comments: No swelling to submandibular space Cardiovascular:     Rate and Rhythm: Normal rate.  Pulmonary:     Effort: Pulmonary effort is normal.  Abdominal:     Palpations: Abdomen is soft.     Tenderness: There is no abdominal tenderness.  Musculoskeletal:     Cervical back: Normal range of motion and neck supple. No edema, erythema, signs of trauma, rigidity, torticollis or crepitus. No pain with movement, spinous process tenderness or muscular tenderness. Normal range of motion.  Skin:    General: Skin is warm and dry.  Neurological:     General: No focal deficit present.     Mental Status: She is alert.     GCS: GCS eye subscore is 4. GCS verbal subscore is 5. GCS motor subscore is 6.     Cranial Nerves: No cranial nerve deficit or facial asymmetry.     Sensory: Sensation is intact.     Motor: No weakness, tremor, seizure activity or pronator drift.      Coordination: Romberg sign negative. Finger-Nose-Finger Test normal.     Gait: Gait is intact. Gait normal.     Comments: CN II-XII intact, equal grip strength, +5 strength to bilateral upper and lower extremities, sensation to light touch intact to bilateral upper and lower extremities.  Psychiatric:  Behavior: Behavior is cooperative.    ED Results / Procedures / Treatments   Labs (all labs ordered are listed, but only abnormal results are displayed) Labs Reviewed - No data to display  EKG None  Radiology No results found.  Procedures .Ear Cerumen Removal  Date/Time: 02/12/2021 12:02 AM Performed by: Loni Beckwith, PA-C Authorized by: Loni Beckwith, PA-C   Consent:    Consent obtained:  Verbal   Consent given by:  Patient   Risks discussed:  Bleeding, dizziness, infection, incomplete removal, pain and TM perforation   Alternatives discussed:  No treatment Universal protocol:    Procedure explained and questions answered to patient or proxy's satisfaction: yes     Patient identity confirmed:  Verbally with patient Procedure details:    Location:  L ear and R ear   Procedure type: curette     Procedure outcomes: cerumen removed   Post-procedure details:    Inspection:  TM intact, no bleeding and some cerumen remaining   Hearing quality:  Improved   Procedure completion:  Tolerated well, no immediate complications   Medications Ordered in ED Medications - No data to display  ED Course  I have reviewed the triage vital signs and the nursing notes.  Pertinent labs & imaging results that were available during my care of the patient were reviewed by me and considered in my medical decision making (see chart for details).    MDM Rules/Calculators/A&P                          Alert 70 year old female no acute distress, nontoxic-appearing.  Presents to ED with chief complaint of bilateral ear fullness and dizziness.  Patient reports that symptoms have  been present over the last 4 days.  States that dizziness is only present when moving from laying to sitting or standing position.  Dizziness last for 6 to 7 seconds before resolution.  Patient states that she has had similar symptoms in the past with cerumen impactions.  Neurologic exam is reassuring.  Patient denies any numbness, weakness, facial asymmetry, dysarthria, aphasia, visual disturbance.  Low suspicion for CVA at this time.  Patient has bilateral cerumen impaction.  We will have RN perform cerumen removal of ear irrigation.  Are unable to remove cerumen.  I performed your cerumen removal using curette and irrigation.  Able to remove majority of cerumen from bilateral ear canals.  TM intact bilaterally with no bulging or erythema.  Patient reports improvement in ear fullness and hearing after cerumen removal.  Patient continues to have no focal neurological deficits.  Able to stand and ambulate without difficulty.  Patient given strict return precautions regarding dizziness and any strokelike symptoms.    Discussed results, findings, treatment and follow up. Patient advised of return precautions. Patient verbalized understanding and agreed with plan.      Final Clinical Impression(s) / ED Diagnoses Final diagnoses:  Bilateral impacted cerumen  Dizziness    Rx / DC Orders ED Discharge Orders     None        Loni Beckwith, PA-C 02/12/21 0005    Lennice Sites, DO 02/12/21 1554

## 2021-02-11 NOTE — ED Triage Notes (Addendum)
C/o bil fullness and dizziness x 4 days hx vertigo , requesting ear wax removal

## 2021-04-01 DIAGNOSIS — Z0100 Encounter for examination of eyes and vision without abnormal findings: Secondary | ICD-10-CM | POA: Diagnosis not present

## 2021-05-12 ENCOUNTER — Other Ambulatory Visit: Payer: Self-pay

## 2021-05-12 ENCOUNTER — Encounter (HOSPITAL_BASED_OUTPATIENT_CLINIC_OR_DEPARTMENT_OTHER): Payer: Self-pay | Admitting: Emergency Medicine

## 2021-05-12 ENCOUNTER — Emergency Department (HOSPITAL_BASED_OUTPATIENT_CLINIC_OR_DEPARTMENT_OTHER)
Admission: EM | Admit: 2021-05-12 | Discharge: 2021-05-12 | Disposition: A | Discharge status: Home / Self Care | Payer: Medicare HMO | Attending: Emergency Medicine | Admitting: Emergency Medicine

## 2021-05-12 DIAGNOSIS — S0512XA Contusion of eyeball and orbital tissues, left eye, initial encounter: Secondary | ICD-10-CM | POA: Diagnosis not present

## 2021-05-12 DIAGNOSIS — X58XXXA Exposure to other specified factors, initial encounter: Secondary | ICD-10-CM | POA: Diagnosis not present

## 2021-05-12 DIAGNOSIS — I10 Essential (primary) hypertension: Secondary | ICD-10-CM | POA: Diagnosis not present

## 2021-05-12 DIAGNOSIS — Z91040 Latex allergy status: Secondary | ICD-10-CM | POA: Diagnosis not present

## 2021-05-12 DIAGNOSIS — S0532XA Ocular laceration without prolapse or loss of intraocular tissue, left eye, initial encounter: Secondary | ICD-10-CM | POA: Insufficient documentation

## 2021-05-12 MED ORDER — DIAZEPAM 2 MG PO TABS
2.0000 mg | ORAL_TABLET | Freq: Once | ORAL | Status: AC
  Administered 2021-05-12: 2 mg via ORAL
  Filled 2021-05-12: qty 1

## 2021-05-12 MED ORDER — LOSARTAN POTASSIUM 50 MG PO TABS: 50.0000 mg | ORAL_TABLET | Freq: Two times a day (BID) | ORAL | 0 refills | Status: AC

## 2021-05-12 MED ORDER — LOSARTAN POTASSIUM 25 MG PO TABS
50.0000 mg | ORAL_TABLET | Freq: Once | ORAL | Status: AC
  Administered 2021-05-12: 50 mg via ORAL
  Filled 2021-05-12: qty 2

## 2021-05-12 NOTE — ED Triage Notes (Signed)
Pt presents with blood in left eye that suddenly appeared ~1 hour ago. Mild pain. Denies vision changes, HA, n/v. Pt reports the past few days have been "stressful" ?

## 2021-05-12 NOTE — Discharge Instructions (Addendum)
You have been seen and discharged from the emergency department.  You were found to have a subconjunctival hemorrhage of the left eye, most likely from stress/elevated blood pressure.  Increase your losartan dose from 50 mg a day to 50 mg twice a day, new prescription has been sent for you.  Follow-up with your primary provider and optometrist for further evaluation and further care. Take home medications as prescribed. If you have any worsening symptoms or further concerns for your health please return to an emergency department for further evaluation. ?

## 2021-05-12 NOTE — ED Provider Notes (Signed)
?[facility] EMERGENC[facility] DEPARTMENT ?Provider Note ? ? ?CSN: [id number] ?Arrival date & time: 05/12/21  2122 ? ?  ? ?History ? ?Chief Complaint  ?Patient presents with  ? Eye Problem  ? ? ?Alejandra Hebert is a 71 y.o. female. ? ?HPI ? ? 71 year old female presents emergency department with red left eye.  Patient states earlier today while driving she felt very stressed out and had a sneezing episode.  When she got home she noticed that her blood pressure was elevated with the top number greater than 170.  She is been compliant with her losartan.  This is when she noticed that she had bright red blood around the left eye.  She has a mild foreign body sensation but denies any other acute trauma to the eye.  She is not a contact lens wear.  No vision changes.  No headache or eye pain.  Does not take any blood thinning medication. ? ?Home Medications ?Prior to Admission medications   ?Medication Sig Start Date End Date Taking? Authorizing Provider  ?ibuprofen (ADVIL,MOTRIN) 200 MG tablet Take 400 mg by mouth every 6 (six) hours as needed for headache or moderate pain.    [provider]  ?losartan (COZAAR) 50 MG tablet Take 50 mg by mouth daily.     [provider]  ?methocarbamol (ROBAXIN) 500 MG tablet Take 1 tablet (500 mg total) by mouth every 8 (eight) hours as needed for muscle spasms. 11/18/20   Lucrezia Starch, MD  ?triamcinolone (KENALOG) 0.1 % paste Apply to mouth sores twice daily. 01/31/18   Molpus, Jenny Reichmann, MD  ?   ? ?Allergies    ?Latex, Erythromycin base, Etodolac, Mobic [meloxicam], Erythromycin, and Sulfa antibiotics   ? ?Review of Systems   ?Review of Systems  ?Constitutional:  Negative for fever.  ?Eyes:  Positive for redness. Negative for photophobia, pain, discharge and visual disturbance.  ?Respiratory:  Negative for shortness of breath.   ?Cardiovascular:  Negative for chest pain.  ?Gastrointestinal:  Negative for abdominal pain.  ?Skin:  Negative for rash.  ?Neurological:   Negative for headaches.  ? ?Physical Exam ?Updated Vital Signs ?BP (!) 162/98   Pulse 68   Temp 98.5 ?F (36.9 ?C) (Oral)   Resp 15   Ht '5\' 6"'$  (1.676 m)   SpO2 98%   BMI 29.21 kg/m?  ?Physical Exam ?Constitutional:   ?   General: She is not in acute distress. ?HENT:  ?   Head: Normocephalic.  ?   Right Ear: External ear normal.  ?   Left Ear: External ear normal.  ?   Nose: Nose normal.  ?   Mouth/Throat:  ?   Mouth: Mucous membranes are moist.  ?Eyes:  ?   General: No scleral icterus.    ?   Right eye: No discharge.     ?   Left eye: No discharge.  ?   Extraocular Movements: Extraocular movements intact.  ?   Pupils: Pupils are equal, round, and reactive to light.  ?   Comments: Left eye subconjunctival hemorrhage with scleral rupture.  Pupils are equal and baseline, vision intact.  No eyelid abnormalities  ?Cardiovascular:  ?   Rate and Rhythm: Normal rate.  ?Pulmonary:  ?   Effort: No respiratory distress.  ?Neurological:  ?   Mental Status: Mental status is at baseline.  ?Psychiatric:     ?   Mood and Affect: Mood normal.  ? ? ?ED Results / Procedures / Treatments   ?  Labs ?(all labs ordered are listed, but only abnormal results are displayed) ?Labs Reviewed - No data to display ? ?EKG ?None ? ?Radiology ?No results found. ? ?Procedures ?Procedures  ? ? ?Medications Ordered in ED ?Medications  ?losartan (COZAAR) tablet 50 mg (50 mg Oral Given 05/12/21 2242)  ? ? ?ED Course/ Medical Decision Making/ A&P ?  ?                        ?Medical Decision Making ?Risk ?Prescription drug management. ? ? ?71 year old female presents emergency department with left eye redness.  She has left eye subconjunctival hemorrhage with scleral rupture.  Pupils otherwise unremarkable, vision intact.  No significant pain or foreign body sensation.  Low suspicion for corneal abrasion.  This seems isolated to an episode of sneezing with hypertension.  She is slightly hypertensive here, will increase her losartan dose.  Patient at  this time appears safe and stable for discharge and close outpatient follow up. Discharge plan and strict return to ED precautions discussed, patient verbalizes understanding and agreement. ? ? ? ? ? ? ? ?Final Clinical Impression(s) / ED Diagnoses ?Final diagnoses:  ?None  ? ? ?Rx / DC Orders ?ED Discharge Orders   ? ? None  ? ?  ? ? ?  ?Lorelle Gibbs, DO ?05/12/21 2323 ? ?

## 2021-05-16 DIAGNOSIS — I10 Essential (primary) hypertension: Secondary | ICD-10-CM | POA: Diagnosis not present

## 2021-05-16 DIAGNOSIS — R69 Illness, unspecified: Secondary | ICD-10-CM | POA: Diagnosis not present

## 2021-05-16 DIAGNOSIS — H1130 Conjunctival hemorrhage, unspecified eye: Secondary | ICD-10-CM | POA: Diagnosis not present

## 2021-05-16 DIAGNOSIS — H1132 Conjunctival hemorrhage, left eye: Secondary | ICD-10-CM | POA: Diagnosis not present

## 2021-08-19 DIAGNOSIS — J3489 Other specified disorders of nose and nasal sinuses: Secondary | ICD-10-CM | POA: Diagnosis not present

## 2021-08-19 DIAGNOSIS — H6122 Impacted cerumen, left ear: Secondary | ICD-10-CM | POA: Diagnosis not present

## 2021-10-12 DIAGNOSIS — R69 Illness, unspecified: Secondary | ICD-10-CM | POA: Diagnosis not present

## 2021-10-12 DIAGNOSIS — R202 Paresthesia of skin: Secondary | ICD-10-CM | POA: Diagnosis not present

## 2021-10-12 DIAGNOSIS — R7303 Prediabetes: Secondary | ICD-10-CM | POA: Diagnosis not present

## 2021-10-12 DIAGNOSIS — I10 Essential (primary) hypertension: Secondary | ICD-10-CM | POA: Diagnosis not present

## 2021-12-29 DIAGNOSIS — D3132 Benign neoplasm of left choroid: Secondary | ICD-10-CM | POA: Diagnosis not present

## 2022-01-13 DIAGNOSIS — Z01419 Encounter for gynecological examination (general) (routine) without abnormal findings: Secondary | ICD-10-CM | POA: Diagnosis not present

## 2022-01-13 DIAGNOSIS — Z124 Encounter for screening for malignant neoplasm of cervix: Secondary | ICD-10-CM | POA: Diagnosis not present

## 2022-01-13 DIAGNOSIS — Z859 Personal history of malignant neoplasm, unspecified: Secondary | ICD-10-CM | POA: Diagnosis not present

## 2022-01-13 DIAGNOSIS — Z6834 Body mass index (BMI) 34.0-34.9, adult: Secondary | ICD-10-CM | POA: Diagnosis not present

## 2022-01-13 DIAGNOSIS — Z1231 Encounter for screening mammogram for malignant neoplasm of breast: Secondary | ICD-10-CM | POA: Diagnosis not present

## 2022-01-13 DIAGNOSIS — Z13820 Encounter for screening for osteoporosis: Secondary | ICD-10-CM | POA: Diagnosis not present

## 2022-01-13 DIAGNOSIS — Z1239 Encounter for other screening for malignant neoplasm of breast: Secondary | ICD-10-CM | POA: Diagnosis not present

## 2022-01-17 ENCOUNTER — Other Ambulatory Visit: Payer: Self-pay | Admitting: Obstetrics and Gynecology

## 2022-01-17 ENCOUNTER — Encounter: Payer: Self-pay | Admitting: Obstetrics and Gynecology

## 2022-01-17 DIAGNOSIS — Z13820 Encounter for screening for osteoporosis: Secondary | ICD-10-CM

## 2022-03-09 DIAGNOSIS — Z23 Encounter for immunization: Secondary | ICD-10-CM | POA: Diagnosis not present

## 2022-03-09 DIAGNOSIS — I10 Essential (primary) hypertension: Secondary | ICD-10-CM | POA: Diagnosis not present

## 2022-03-09 DIAGNOSIS — M170 Bilateral primary osteoarthritis of knee: Secondary | ICD-10-CM | POA: Diagnosis not present

## 2022-03-09 DIAGNOSIS — R69 Illness, unspecified: Secondary | ICD-10-CM | POA: Diagnosis not present

## 2022-03-16 ENCOUNTER — Ambulatory Visit
Admission: RE | Admit: 2022-03-16 | Discharge: 2022-03-16 | Disposition: A | Discharge status: Home / Self Care | Payer: Medicare HMO | Source: Ambulatory Visit | Attending: Obstetrics and Gynecology | Admitting: Obstetrics and Gynecology

## 2022-03-16 DIAGNOSIS — Z13820 Encounter for screening for osteoporosis: Secondary | ICD-10-CM

## 2022-03-16 DIAGNOSIS — Z780 Asymptomatic menopausal state: Secondary | ICD-10-CM | POA: Diagnosis not present

## 2022-03-25 DIAGNOSIS — R0981 Nasal congestion: Secondary | ICD-10-CM | POA: Diagnosis not present

## 2022-03-25 DIAGNOSIS — R059 Cough, unspecified: Secondary | ICD-10-CM | POA: Diagnosis not present

## 2022-03-25 DIAGNOSIS — U071 COVID-19: Secondary | ICD-10-CM | POA: Diagnosis not present

## 2022-03-25 DIAGNOSIS — R52 Pain, unspecified: Secondary | ICD-10-CM | POA: Diagnosis not present

## 2022-03-25 DIAGNOSIS — R6889 Other general symptoms and signs: Secondary | ICD-10-CM | POA: Diagnosis not present

## 2022-04-13 DIAGNOSIS — M170 Bilateral primary osteoarthritis of knee: Secondary | ICD-10-CM | POA: Diagnosis not present

## 2022-04-13 DIAGNOSIS — M1711 Unilateral primary osteoarthritis, right knee: Secondary | ICD-10-CM | POA: Diagnosis not present

## 2022-04-13 DIAGNOSIS — M1712 Unilateral primary osteoarthritis, left knee: Secondary | ICD-10-CM | POA: Diagnosis not present

## 2022-04-29 DIAGNOSIS — H1089 Other conjunctivitis: Secondary | ICD-10-CM | POA: Diagnosis not present

## 2023-01-30 ENCOUNTER — Other Ambulatory Visit: Payer: Self-pay

## 2023-01-30 ENCOUNTER — Emergency Department (HOSPITAL_BASED_OUTPATIENT_CLINIC_OR_DEPARTMENT_OTHER): Payer: Medicare HMO

## 2023-01-30 ENCOUNTER — Emergency Department (HOSPITAL_BASED_OUTPATIENT_CLINIC_OR_DEPARTMENT_OTHER)
Admission: EM | Admit: 2023-01-30 | Discharge: 2023-01-30 | Disposition: A | Discharge status: Home / Self Care | Payer: Medicare HMO | Attending: Emergency Medicine | Admitting: Emergency Medicine

## 2023-01-30 ENCOUNTER — Encounter (HOSPITAL_BASED_OUTPATIENT_CLINIC_OR_DEPARTMENT_OTHER): Payer: Self-pay

## 2023-01-30 DIAGNOSIS — R600 Localized edema: Secondary | ICD-10-CM | POA: Diagnosis present

## 2023-01-30 DIAGNOSIS — I10 Essential (primary) hypertension: Secondary | ICD-10-CM | POA: Insufficient documentation

## 2023-01-30 DIAGNOSIS — R030 Elevated blood-pressure reading, without diagnosis of hypertension: Secondary | ICD-10-CM

## 2023-01-30 LAB — BASIC METABOLIC PANEL
Anion gap: 7 (ref 5–15)
BUN: 20 mg/dL (ref 8–23)
CO2: 31 mmol/L (ref 22–32)
Calcium: 9.6 mg/dL (ref 8.9–10.3)
Chloride: 106 mmol/L (ref 98–111)
Creatinine, Ser: 0.6 mg/dL (ref 0.44–1.00)
GFR, Estimated: >60 mL/min (ref >60)
Glucose, Bld: 107 mg/dL — ABNORMAL HIGH (ref 70–99)
Potassium: 3.9 mmol/L (ref 3.5–5.1)
Sodium: 144 mmol/L (ref 135–145)

## 2023-01-30 LAB — CBC WITH DIFFERENTIAL/PLATELET
Abs Immature Granulocytes: 0.01 10*3/uL (ref 0.00–0.07)
Basophils Absolute: 0 10*3/uL (ref 0.0–0.1)
Basophils Relative: 0 %
Eosinophils Absolute: 0 10*3/uL (ref 0.0–0.5)
Eosinophils Relative: 0 %
HCT: 36.9 % (ref 36.0–46.0)
Hemoglobin: 12.3 g/dL (ref 12.0–15.0)
Immature Granulocytes: 0 %
Lymphocytes Relative: 21 %
Lymphs Abs: 1.2 10*3/uL (ref 0.7–4.0)
MCH: 31.5 pg (ref 26.0–34.0)
MCHC: 33.3 g/dL (ref 30.0–36.0)
MCV: 94.4 fL (ref 80.0–100.0)
Monocytes Absolute: 0.6 10*3/uL (ref 0.1–1.0)
Monocytes Relative: 10 %
Neutro Abs: 4 10*3/uL (ref 1.7–7.7)
Neutrophils Relative %: 69 %
Platelets: 234 10*3/uL (ref 150–400)
RBC: 3.91 MIL/uL (ref 3.87–5.11)
RDW: 12.2 % (ref 11.5–15.5)
WBC: 5.8 10*3/uL (ref 4.0–10.5)
nRBC: 0 % (ref 0.0–0.2)

## 2023-01-30 LAB — BRAIN NATRIURETIC PEPTIDE: B Natriuretic Peptide: 185.3 pg/mL — ABNORMAL HIGH (ref 0.0–100.0)

## 2023-01-30 NOTE — ED Provider Notes (Signed)
Emergency Department Provider Note   I have reviewed the triage vital signs and the nursing notes.   HISTORY  Chief Complaint Joint Swelling   HPI Alejandra Hebert is a 72 y.o. female with past history reviewed below presents to the emergency department with face flushing, whooshing sound in the ears, intermittent ankle swelling.  Patient tells me that her ankles have been swelling intermittently over the past several weeks.  She typically uses compression stockings but not as much recently.  Her ankles are currently at their normal size but notices increased swelling at the end of the day.  She has not had significant shortness of breath but this morning felt like her face was flushed and red.  Her husband, at bedside, reports that her face also seemed red during Thanksgiving this past week.  No new medications.  Denies severe headaches, vision changes, chest pain, shortness of breath. Patient did have a steroid injection in the knee yesterday with improved pain symptoms. d   Past Medical History:  Diagnosis Date   Abnormal Pap smear 09/08/2004   LGSIL/ colpo 10/18/2004   Allergic rhinitis    HTN (hypertension)    Irritable bowel syndrome    Menopause    Vertigo     Review of Systems  Constitutional: No fever/chills Cardiovascular: Denies chest pain. Positive intermittent ankle swelling.  Respiratory: Denies shortness of breath. Gastrointestinal: No abdominal pain.  No nausea, no vomiting.   Skin: Negative for rash. Positive facial flushing.  Neurological: Negative for headaches.  ____________________________________________   PHYSICAL EXAM:  VITAL SIGNS: ED Triage Vitals  Encounter Vitals Group     BP 01/30/23 0953 (!) 173/83     Pulse Rate 01/30/23 0953 71     Resp 01/30/23 0953 18     Temp 01/30/23 0953 98.6 F (37 C)     Temp Source 01/30/23 0953 Oral     SpO2 01/30/23 0953 98 %     Weight 01/30/23 0954 200 lb (90.7 kg)     Height 01/30/23 0954 5\' 5"  (1.651  m)   Constitutional: Alert and oriented. Well appearing and in no acute distress. Eyes: Conjunctivae are normal.  Head: Atraumatic. Nose: No congestion/rhinnorhea. Mouth/Throat: Mucous membranes are moist. Neck: No stridor.   Cardiovascular: Normal rate, regular rhythm. Good peripheral circulation. Grossly normal heart sounds.   Respiratory: Normal respiratory effort.  No retractions. Lungs CTAB. Gastrointestinal: Soft and nontender. No distention.  Musculoskeletal: No lower extremity tenderness with trace edema in the bilateral LEs. No gross deformities of extremities. Neurologic:  Normal speech and language. No gross focal neurologic deficits are appreciated.  Skin:  Skin is warm, dry and intact. No rash noted. No urticaria.   ____________________________________________   LABS (all labs ordered are listed, but only abnormal results are displayed)  Labs Reviewed  BASIC METABOLIC PANEL - Abnormal; Notable for the following components:      Result Value   Glucose, Bld 107 (*)    All other components within normal limits  BRAIN NATRIURETIC PEPTIDE - Abnormal; Notable for the following components:   B Natriuretic Peptide 185.3 (*)    All other components within normal limits  CBC WITH DIFFERENTIAL/PLATELET   ____________________________________________  RADIOLOGY  DG Chest Portable 1 View  Result Date: 01/30/2023 CLINICAL DATA:  Shortness of breath EXAM: PORTABLE CHEST 1 VIEW COMPARISON:  None Available. FINDINGS: No consolidation, pneumothorax or effusion. No edema. Normal cardiopericardial silhouette. Curvature and degenerative changes of the spine IMPRESSION: No acute cardiopulmonary disease. Electronically  Signed   By: Karen Kays M.D.   On: 01/30/2023 10:42    ____________________________________________   PROCEDURES  Procedure(s) performed:   Procedures  None  ____________________________________________   INITIAL IMPRESSION / ASSESSMENT AND PLAN / ED  COURSE  Pertinent labs & imaging results that were available during my care of the patient were reviewed by me and considered in my medical decision making (see chart for details).   This patient is Presenting for Evaluation of ankle swelling, which does require a range of treatment options, and is a complaint that involves a high risk of morbidity and mortality.  The Differential Diagnoses include pulmonary edema, peripheral edema, acute kidney injury, CHF, allergic reaction, etc.  I did obtain Additional Historical Information from husband at bedside.   Clinical Laboratory Tests Ordered, included BNP mildly elevated at 185. No AKI. No anemia or leukocytosis.   Radiologic Tests Ordered, included CXR. I independently interpreted the images and agree with radiology interpretation.   Cardiac Monitor Tracing which shows NSR.    Social Determinants of Health Risk patient is not an active smoker.   Medical Decision Making: Summary:  Patient presents to the emergency department with ankle swelling, whooshing sound in the ears and facial flushing.  She does not appear acutely volume overloaded.  She is hypertensive and I plan for screening lab work to specifically evaluate kidney function and BNP levels.  Will obtain chest x-ray looking for pulmonary edema.  Patient has been prescribed amlodipine back in March but never started this medication.  Her elevated blood pressure here may suggest that she needs to start although have cautioned her regarding the possibility of ankle swelling on this medication and to continue her compression stockings more regularly along with leg elevation at the end of the day.  Reevaluation with update and discussion with patient and husband. Plan to take BP meds as prescribed including amlodipine. Advised using compression stockings and elevate legs. Discussed salt reduction. Provided handout regarding the DASH diet. Facial flushing is non-specific but does not appear to  be 2/2 an acute allergic reaction.   Considered admission but workup is reassuring. Patient stable for outpatient mgmt and PCP follow up with strict ED return precautions.   Patient's presentation is most consistent with acute, uncomplicated illness.   Disposition: discharge  ____________________________________________  FINAL CLINICAL IMPRESSION(S) / ED DIAGNOSES  Final diagnoses:  Peripheral edema  Elevated blood pressure reading    Note:  This document was prepared using Dragon voice recognition software and may include unintentional dictation errors.  Alona Bene, MD, [facility] Emergency Medicine    Gustabo Gordillo, Arlyss Repress, MD 01/30/23 1200

## 2023-01-30 NOTE — Discharge Instructions (Signed)
You were seen in the emerged from today with elevated blood pressure, leg swelling, face flushing.  I would like for you to take your medications as prescribed including the amlodipine.  We also discussed using your compression stockings more regularly and elevating your legs.  It would be wise to reduce salt intake as well in your diet.  Please follow closely with your primary care doctor for blood pressure recheck as changes to medication dosing is often required.

## 2023-01-30 NOTE — ED Triage Notes (Signed)
Pt presents pov and reports waking up this am with her face flushed and a ring in both ears. Pt reported getting a Cortizone shot in her knee yesterday and concerned that she might be having a reaction to it. Also reports swelling in ankles on and off for a moth and half, See and PCP and told it was as a result of her eating too much sodium.

## 2023-01-30 NOTE — ED Notes (Signed)
Pt alert and oriented X 4 at the time of discharge. RR even and unlabored. No acute distress noted. Pt verbalized understanding of discharge instructions as discussed. Pt ambulatory to lobby at time of discharge.

## 2023-02-05 ENCOUNTER — Emergency Department (HOSPITAL_COMMUNITY)
Admission: EM | Admit: 2023-02-05 | Discharge: 2023-02-05 | Discharge status: Against Medical Advice | Payer: Medicare HMO | Attending: Emergency Medicine | Admitting: Emergency Medicine

## 2023-02-05 ENCOUNTER — Other Ambulatory Visit: Payer: Self-pay

## 2023-02-05 DIAGNOSIS — I10 Essential (primary) hypertension: Secondary | ICD-10-CM | POA: Diagnosis present

## 2023-02-05 DIAGNOSIS — Z5321 Procedure and treatment not carried out due to patient leaving prior to being seen by health care provider: Secondary | ICD-10-CM | POA: Insufficient documentation

## 2023-02-05 LAB — CBC
HCT: 42.5 % (ref 36.0–46.0)
Hemoglobin: 14 g/dL (ref 12.0–15.0)
MCH: 32 pg (ref 26.0–34.0)
MCHC: 32.9 g/dL (ref 30.0–36.0)
MCV: 97.3 fL (ref 80.0–100.0)
Platelets: 254 10*3/uL (ref 150–400)
RBC: 4.37 MIL/uL (ref 3.87–5.11)
RDW: 12.4 % (ref 11.5–15.5)
WBC: 6.4 10*3/uL (ref 4.0–10.5)
nRBC: 0 % (ref 0.0–0.2)

## 2023-02-05 LAB — COMPREHENSIVE METABOLIC PANEL
ALT: 14 U/L (ref 0–44)
AST: 14 U/L — ABNORMAL LOW (ref 15–41)
Albumin: 4.4 g/dL (ref 3.5–5.0)
Alkaline Phosphatase: 56 U/L (ref 38–126)
Anion gap: 8 (ref 5–15)
BUN: 23 mg/dL (ref 8–23)
CO2: 29 mmol/L (ref 22–32)
Calcium: 9.4 mg/dL (ref 8.9–10.3)
Chloride: 103 mmol/L (ref 98–111)
Creatinine, Ser: 0.76 mg/dL (ref 0.44–1.00)
GFR, Estimated: >60 mL/min (ref >60)
Glucose, Bld: 113 mg/dL — ABNORMAL HIGH (ref 70–99)
Potassium: 3.9 mmol/L (ref 3.5–5.1)
Sodium: 140 mmol/L (ref 135–145)
Total Bilirubin: 0.6 mg/dL (ref <1.2)
Total Protein: 7.5 g/dL (ref 6.5–8.1)

## 2023-02-05 NOTE — ED Notes (Signed)
Pt states her symptoms are not improving. States that she is not feeling worse but is also not feeling any better. Triage RN notified

## 2023-02-05 NOTE — ED Triage Notes (Signed)
Pt arrived via POV. C/o HTN, no HA, but hears whooshing sound in ears.  Taking HTN meds

## 2023-02-05 NOTE — ED Notes (Signed)
Pt called up for repeated vitals, pt had no worsening symptoms , BP was more elevated than before, RN Huntley Dec notified. All other VS WNL.

## 2023-02-05 NOTE — ED Provider Triage Note (Signed)
Emergency Medicine Provider Triage Evaluation Note  Alejandra Hebert , a 72 y.o. female  was evaluated in triage.  Pt complains of HTN.  Review of Systems  Positive: htn Negative: Chest pain, shortness of breath, headache, blurry vision or change in vision, abdominal pain, nausea vomiting diarrhea  Physical Exam  BP (!) 176/102 (BP Location: Left Arm)   Pulse 87   Temp 98.7 F (37.1 C) (Oral)   Resp 14   Ht 5\' 5"  (1.651 m)   Wt 90.3 kg   SpO2 96%   BMI 33.12 kg/m  Gen:   Awake, no distress   Resp:  Normal effort  MSK:   Moves extremities without difficulty    Medical Decision Making  Medically screening exam initiated at 3:41 PM.  Appropriate orders placed.  Alejandra Hebert was informed that the remainder of the evaluation will be completed by another provider, this initial triage assessment does not replace that evaluation, and the importance of remaining in the ED until their evaluation is complete.  Patient reports for the past week she has had elevated blood pressure readings at home.  Patient reports as high as 250 systolic.  When speaking to patient she reports she has been taking her blood pressure over her elbow not above her elbow.  She is unsure if this is a wrist cuff versus an upper arm cuff however she thinks she has been taking blood pressure incorrectly.  He was recently put on amlodipine 4 days ago by primary care.  She also reports she has been taking her losartan. Report pmhx htn.   Smitty Knudsen, PA-C 02/05/23 8657

## 2023-03-09 DIAGNOSIS — H9311 Tinnitus, right ear: Secondary | ICD-10-CM | POA: Diagnosis not present

## 2023-03-09 DIAGNOSIS — F321 Major depressive disorder, single episode, moderate: Secondary | ICD-10-CM | POA: Diagnosis not present

## 2023-03-09 DIAGNOSIS — F419 Anxiety disorder, unspecified: Secondary | ICD-10-CM | POA: Diagnosis not present

## 2023-03-16 ENCOUNTER — Telehealth (INDEPENDENT_AMBULATORY_CARE_PROVIDER_SITE_OTHER): Payer: Self-pay | Admitting: Otolaryngology

## 2023-03-16 NOTE — Telephone Encounter (Signed)
Reminder Call: Date: 03/19/2023 Status: Sch  Time: 11:30 AM [address] [place], [place] [address]  Confirmed w/patient

## 2023-03-19 ENCOUNTER — Ambulatory Visit (INDEPENDENT_AMBULATORY_CARE_PROVIDER_SITE_OTHER): Payer: Medicare HMO | Admitting: Otolaryngology

## 2023-03-19 ENCOUNTER — Encounter (INDEPENDENT_AMBULATORY_CARE_PROVIDER_SITE_OTHER): Payer: Self-pay

## 2023-03-19 VITALS — BP 120/73 | HR 63 | Resp 19 | Ht 65.0 in | Wt 190.0 lb

## 2023-03-19 DIAGNOSIS — H93A1 Pulsatile tinnitus, right ear: Secondary | ICD-10-CM

## 2023-03-19 NOTE — Progress Notes (Signed)
 Dear Dr. Tracie Harrier, Here is my assessment for our mutual patient, Alejandra Hebert. Thank you for allowing me the opportunity to care for your patient. Please do not hesitate to contact me should you have any other questions. Sincerely, Dr. Jovita Kussmaul  Otolaryngology Clinic Note Referring provider: Dr. Tracie Harrier HPI:  Alejandra Hebert is a 73 y.o. female kindly referred by Dr. Tracie Harrier for evaluation of tinnitus.   Initial visit (02/2023): Patient reports: "whooshing sound" intermittent (associated with heartbeat) - lot of anxiety and depression with this; no pain, some intermittent lightheadedness (pre-syncopal episodes) when she sits down, feels ok. Ongoing for about 8 months. No headaches, vision changes. No sound or pressure induced vertigo. No autophony. Right sided.  Patient denies: ear pain, fullness, vertigo, drainage Patient also denies barotrauma, vestibular suppressant use, ototoxic medication use Prior ear surgery: no  Personal or FHx of bleeding dz or anesthesia difficulty: no   Independent Review of Additional Tests or Records:  Dr. Tracie Harrier (03/09/2023): Query pulsatile tinnitus right ear. 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 No pertinent family history.   Social Connections: Not on file      Current Outpatient Medications:    chlorthalidone (HYGROTON) 25 MG tablet, Take 25 mg by mouth every morning., Disp: , Rfl:    ibuprofen (ADVIL,MOTRIN) 200 MG tablet, Take 400 mg by mouth every 6 (six) hours as needed for headache or moderate pain., Disp: , Rfl:    losartan (COZAAR) 50 MG tablet, Take 1 tablet (50 mg  total) by mouth in the morning and at bedtime., Disp: 60 tablet, Rfl: 0   methocarbamol (ROBAXIN) 500 MG tablet, Take 1 tablet (500 mg total) by mouth every 8 (eight) hours as needed for muscle spasms. (Patient not taking: Reported on 03/19/2023), Disp: 20 tablet, Rfl: 0   triamcinolone (KENALOG) 0.1 % paste, Apply to mouth sores twice daily. (Patient not taking: Reported on 03/19/2023), Disp: 5 g, Rfl: 1   Physical Exam:   BP 120/73 (BP Location: Left Arm, Patient Position: Sitting, Cuff Size: Normal)   Pulse 63   Resp 19   Ht 5\' 5"  (1.651 m)   Wt 190 lb (86.2 kg)   SpO2 96%   BMI 31.62 kg/m   Salient findings:  CN II-XII intact Given history and complaints, ear microscopy was indicated and performed for evaluation with findings as below in physical exam section and in procedures Bilateral EAC clear and TM intact with well pneumatized middle ear spaces. No pulsatile masses Weber 512: mid Rinne 512: AC > BC b/l No bjective audible pulsatile tinnitus No nystagmus; no change with neck pressure or turning neck Anterior rhinoscopy: Septum intact No lesions of oral cavity/oropharynx; No obviously palpable neck masses/lymphadenopathy/thyromegaly No respiratory distress or stridor  Seprately Identifiable Procedures:  Procedure: Bilateral ear microscopy using microscope (CPT 92504) Pre-procedure diagnosis: pulsatile tinnitus Post-procedure diagnosis: same Indication; given patient's otologic complaints and history, for improved and comprehensive examination of external ear and tympanic membrane, bilateral otologic examination using microscope was performed  Procedure: Patient was placed semi-recumbent. Both ear canals were examined using the microscope  with findings above. Patient tolerated the procedure well.   Impression & Plans:  Alejandra Hebert is a 73 y.o. female with:  1. Pulsatile tinnitus of right ear    We had an extensive discussion about etiology of pulsatile tinnitus. She  does have some claustrophobia. We also discussed workup for this extensively and she would like to ultimately move forward with MRI, MRA/MRV and CT A and CT Temporal bones to rule out common etiologies after much deliberation; Will also get carotid ultrasound.  Should she change her mind, she will let us know. Otherwise f/u with audio in 6-8 weeks   See below regarding exact medications prescribed this encounter including dosages and route: No orders of the defined types were placed in this encounter.     Thank you for allowing me the opportunity to care for your patient. Please do not hesitate to contact me should you have any other questions.  Sincerely, Jovita Kussmaul, MD Otolarynoglogist (ENT), [facility] Phone: [phone] Fax: [phone]  04/25/2023, 8:04 PM   I have personally spent 48 minutes involved in face-to-face and non-face-to-face activities for this patient on the day of the visit.  Professional time spent excludes any procedures performed but includes the following activities, in addition to those noted in the documentation: preparing to see the patient (review of outside documentation and results), performing a medically appropriate examination, extensive counseling about etiologies and workup especially from imaging standpoint given claustrophobia, documenting in the electronic health record

## 2023-03-19 NOTE — Patient Instructions (Signed)
I have ordered an imaging study for you to complete prior to your next visit. Please call [facility] Scheduling at [phone] to schedule your imaging if you have not received a call within 24 hours. If you are unable to complete your imaging study prior to your next scheduled visit please call our office to let us know.

## 2023-03-20 ENCOUNTER — Other Ambulatory Visit: Payer: Self-pay | Admitting: Family Medicine

## 2023-03-20 DIAGNOSIS — H93A3 Pulsatile tinnitus, bilateral: Secondary | ICD-10-CM

## 2023-03-22 ENCOUNTER — Encounter: Payer: Self-pay | Admitting: Family Medicine

## 2023-03-28 DIAGNOSIS — F419 Anxiety disorder, unspecified: Secondary | ICD-10-CM | POA: Diagnosis not present

## 2023-03-28 DIAGNOSIS — H9311 Tinnitus, right ear: Secondary | ICD-10-CM | POA: Diagnosis not present

## 2023-03-28 DIAGNOSIS — I10 Essential (primary) hypertension: Secondary | ICD-10-CM | POA: Diagnosis not present

## 2023-03-29 DIAGNOSIS — H1089 Other conjunctivitis: Secondary | ICD-10-CM | POA: Diagnosis not present

## 2023-04-02 ENCOUNTER — Other Ambulatory Visit: Payer: Self-pay | Admitting: Family Medicine

## 2023-04-03 ENCOUNTER — Ambulatory Visit
Admission: RE | Admit: 2023-04-03 | Discharge: 2023-04-03 | Disposition: A | Discharge status: Home / Self Care | Payer: Medicare HMO | Source: Ambulatory Visit | Attending: Family Medicine

## 2023-04-03 ENCOUNTER — Ambulatory Visit
Admission: RE | Admit: 2023-04-03 | Discharge: 2023-04-03 | Disposition: A | Discharge status: Home / Self Care | Payer: Medicare HMO | Source: Ambulatory Visit | Attending: Family Medicine | Admitting: Family Medicine

## 2023-04-03 DIAGNOSIS — H93A3 Pulsatile tinnitus, bilateral: Secondary | ICD-10-CM

## 2023-04-03 MED ORDER — GADOPICLENOL 0.5 MMOL/ML IV SOLN
9.0000 mL | Freq: Once | INTRAVENOUS | Status: AC | PRN
Start: 2023-04-03 — End: 2023-04-03
  Administered 2023-04-03: 9 mL via INTRAVENOUS

## 2023-04-12 ENCOUNTER — Institutional Professional Consult (permissible substitution) (INDEPENDENT_AMBULATORY_CARE_PROVIDER_SITE_OTHER): Payer: Medicare HMO

## 2023-04-19 ENCOUNTER — Telehealth (INDEPENDENT_AMBULATORY_CARE_PROVIDER_SITE_OTHER): Payer: Self-pay | Admitting: Otolaryngology

## 2023-04-19 NOTE — Telephone Encounter (Signed)
Patient is concerned and confused with MRI she had done and the CT scan she did not have done. Patient stated that when she had her MRI done, she was told that it was covering all symptoms including what she is seeing Alejandra Hebert for so she didn't think she had to do the CT scan. She wants someone to give her a call regarding this matter.

## 2023-04-23 ENCOUNTER — Telehealth: Payer: Self-pay

## 2023-04-23 NOTE — Telephone Encounter (Signed)
 Called patient and advised that her PCP ordered MRI for her symptoms of having the "whooshing" sounds. Explained to her that is the first order we place. Because her symptoms had not improved and to rule out any other health issues including blockage of the arteries, a CT scan and [place] is was ordered. Provided patient the phone number for the scheduling department.  Patient understood and agreed with plan.

## 2023-05-08 ENCOUNTER — Ambulatory Visit (HOSPITAL_COMMUNITY)
Admission: RE | Admit: 2023-05-08 | Discharge: 2023-05-08 | Disposition: A | Discharge status: Home / Self Care | Payer: Medicare HMO | Source: Ambulatory Visit | Attending: Otolaryngology | Admitting: Otolaryngology

## 2023-05-08 ENCOUNTER — Ambulatory Visit (HOSPITAL_BASED_OUTPATIENT_CLINIC_OR_DEPARTMENT_OTHER)
Admission: RE | Admit: 2023-05-08 | Discharge: 2023-05-08 | Disposition: A | Discharge status: Home / Self Care | Payer: Medicare HMO | Source: Ambulatory Visit | Attending: Otolaryngology

## 2023-05-08 ENCOUNTER — Encounter (HOSPITAL_COMMUNITY): Payer: Self-pay

## 2023-05-08 DIAGNOSIS — H93A1 Pulsatile tinnitus, right ear: Secondary | ICD-10-CM | POA: Diagnosis not present

## 2023-05-08 DIAGNOSIS — H93A9 Pulsatile tinnitus, unspecified ear: Secondary | ICD-10-CM | POA: Diagnosis not present

## 2023-05-08 MED ORDER — IOHEXOL 350 MG/ML SOLN: 75.0000 mL | Freq: Once | INTRAVENOUS | Status: DC | PRN

## 2023-05-08 MED ORDER — IOHEXOL 350 MG/ML SOLN
75.0000 mL | Freq: Once | INTRAVENOUS | Status: AC | PRN
  Administered 2023-05-08: 75 mL via INTRAVENOUS

## 2023-05-08 NOTE — Progress Notes (Signed)
 Right carotid artery duplex has been completed. Preliminary results can be found in CV Proc through chart review.   05/08/23 1:33 PM Olen Cordial RVT

## 2023-05-14 ENCOUNTER — Telehealth (INDEPENDENT_AMBULATORY_CARE_PROVIDER_SITE_OTHER): Payer: Self-pay | Admitting: Audiology

## 2023-05-14 NOTE — Telephone Encounter (Signed)
 Reminder Call:  Date: 05/15/2023 Status: Sch  Time: 8:30 AM Patient called to confirm appts- provided location for both-[address] [place], [place] [address]

## 2023-05-15 ENCOUNTER — Encounter (INDEPENDENT_AMBULATORY_CARE_PROVIDER_SITE_OTHER): Payer: Self-pay

## 2023-05-15 ENCOUNTER — Ambulatory Visit (INDEPENDENT_AMBULATORY_CARE_PROVIDER_SITE_OTHER): Payer: Medicare HMO | Admitting: Audiology

## 2023-05-15 ENCOUNTER — Ambulatory Visit (INDEPENDENT_AMBULATORY_CARE_PROVIDER_SITE_OTHER): Payer: Medicare HMO | Admitting: Otolaryngology

## 2023-05-15 DIAGNOSIS — H903 Sensorineural hearing loss, bilateral: Secondary | ICD-10-CM | POA: Diagnosis not present

## 2023-05-15 DIAGNOSIS — H93A1 Pulsatile tinnitus, right ear: Secondary | ICD-10-CM | POA: Diagnosis not present

## 2023-05-15 NOTE — Progress Notes (Signed)
 Dear Dr. Tracie Harrier, Here is my assessment for our mutual patient, Alejandra Hebert. Thank you for allowing me the opportunity to care for your patient. Please do not hesitate to contact me should you have any other questions. Sincerely, Dr. Jovita Kussmaul  Otolaryngology Clinic Note Referring provider: Dr. Tracie Harrier HPI:  Alejandra Hebert is a 73 y.o. female kindly referred by Dr. Tracie Harrier for evaluation of tinnitus.   Initial visit (02/2023): Patient reports: "whooshing sound" intermittent (associated with heartbeat) - lot of anxiety and depression with this; no pain, some intermittent lightheadedness (pre-syncopal episodes) when she sits down, feels ok. Ongoing for about 8 months. No headaches, vision changes. No sound or pressure induced vertigo. No autophony. Right sided.  Patient denies: ear pain, fullness, vertigo, drainage Patient also denies barotrauma, vestibular suppressant use, ototoxic medication use Prior ear surgery: no --------------------------------------------------------- 05/15/2023 Seen in follow up. Reports that pulsatile tinnitus continues on right, but only happens intermittently when she lays on that side. No other new sx. She has had workup for this now. She has neck pain chronic, and reports that some of this may be due to anxiety.   Personal or FHx of bleeding dz or anesthesia difficulty: no   Independent Review of Additional Tests or Records:  Dr. Tracie Harrier (03/09/2023): Query pulsatile tinnitus right ear. Claustrophobic; declined imaging as a result; referred to ENT MRA,  MRI Brain (2/4/20245), CT Temporal Bones, CTA (05/08/2023) and Carotid US (2025): reviewed independently and CT and MRI interpreted independently - agree with read. No high grade venous stenosis, no high riding carotid or jugular or jugular bulb diverticulum, no SCCD, no mastoid or ME masses, no retrocochlear lesions. No noted pathology to explain sx. 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 No pertinent family history.   Social Connections: Not on file      Current Outpatient Medications:    chlorthalidone (HYGROTON) 25 MG tablet, Take 25 mg by mouth every morning., Disp: , Rfl:    ibuprofen (ADVIL,MOTRIN) 200 MG tablet, Take 400 mg by mouth every 6 (six) hours as needed for headache or moderate pain., Disp: , Rfl:    losartan (COZAAR) 50 MG tablet, Take 1 tablet (50 mg total) by mouth in the morning and at bedtime., Disp: 60 tablet, Rfl: 0   methocarbamol (ROBAXIN) 500 MG tablet, Take 1 tablet (500 mg total) by mouth every 8 (eight) hours as needed for muscle spasms. (Patient not taking: Reported on 03/19/2023), Disp: 20 tablet, Rfl: 0   triamcinolone (KENALOG) 0.1 % paste, Apply to mouth sores twice daily. (Patient not taking: Reported on 03/19/2023), Disp: 5 g, Rfl: 1   Physical Exam:   There were no vitals taken for this visit.  Salient findings:  CN II-XII intact  Bilateral EAC clear and TM intact with well pneumatized middle ear spaces. No pulsatile masses Weber 512: mid Rinne 512: AC > BC b/l No objective audible pulsatile tinnitus No nystagmus; no change with neck pressure or turning neck  Seprately  Identifiable Procedures:  None today   Impression & Plans:  Alejandra Hebert is a 73 y.o. female with:  1. Pulsatile tinnitus of right ear   2. Sensorineural hearing loss (SNHL) of both ears    Workup has been negative so far; she is quite relieved from this and attributes sx to anxiety. We did discuss that  next step could be lab w/u or Neuro referral for angio but she would like to hold off on this For HL, recommend observation.   Should she change her mind, she will let us know. F/u as needed; f/u final CT read   See below regarding exact medications prescribed this encounter including dosages and route: No orders of the defined types were placed in this encounter.     Thank you for allowing me the opportunity to care for your patient. Please do not hesitate to contact me should you have any other questions.  Sincerely, Jovita Kussmaul, MD Otolaryngologist (ENT), [facility] Phone: [phone] Fax: [phone]  05/15/2023, 10:24 AM   MDM:  Level 4: 99214 Complexity/Problems addressed: mod - chronic problem, new problem (Hearing loss) Data complexity: mod - independent interpretation of CT and MRI imaging - Morbidity:  - Prescription Drug prescribed or managed: no

## 2023-05-15 NOTE — Progress Notes (Signed)
  [address], [address] [place], [place] [address] [phone]  Audiological Evaluation    Name: Alejandra Hebert     DOB:   09-Feb-1951      MRN:   [id number]                                                                                     Service Date: 05/15/2023     Accompanied by: unaccompanied   Patient comes today after Dr. Allena Katz, ENT sent a referral for a hearing evaluation due to concerns with tinnitus.   Symptoms Yes Details  Hearing loss  []    Tinnitus  [x]  Right pulsatile tinnitus  Ear pain/ infections/pressure  []    Balance problems  []    Noise exposure history  []    Previous ear surgeries  []    Family history of hearing loss  []    Amplification  []    Other  []      Otoscopy: Right ear: Clear external ear canals and notable landmarks visualized on the tympanic membrane. Left ear:  Clear external ear canals and notable landmarks visualized on the tympanic membrane.  Tympanometry: Right ear: Type A- Normal external ear canal volume with normal middle ear pressure and tympanic membrane compliance Left ear: Type A- Normal external ear canal volume with normal middle ear pressure and tympanic membrane compliance  Pure tone Audiometry: Both ears- Normal hearing from [id number] Hz,then mild to moderate presumably  sensorineural hearing loss from 6000 Hz - 8000 Hz.    Speech Audiometry: Right ear- Speech Reception Threshold (SRT) was obtained at 10 dBHL. Left ear-Speech Reception Threshold (SRT) was obtained at 10 dBHL.   Word Recognition Score Tested using NU-6 (MLV) Right ear: 96% was obtained at a presentation level of 55 dBHL with contralateral masking which is deemed as  excellent. Left ear: 96% was obtained at a presentation level of 55 dBHL with contralateral masking which is deemed as  excellent.   The hearing test results were completed under headphones and results are deemed to be of good to fair reliability. Test technique:  conventional      Recommendations: Follow up with ENT as scheduled for today. Return for a hearing evaluation if concerns with hearing changes arise or per MD recommendation.   Sergey Ishler MARIE LEROUX-MARTINEZ, AUD

## 2023-05-25 IMAGING — DX DG LUMBAR SPINE COMPLETE 4+V
5 series · 5 of 5 positions shown · non-contrast
Comparison: CT abdomen and pelvis 04/21/2010.

CLINICAL DATA: Back pain.

EXAM:
THORACIC SPINE 2 VIEWS; LUMBAR SPINE - COMPLETE 4+ VIEW

[l-spine ap]
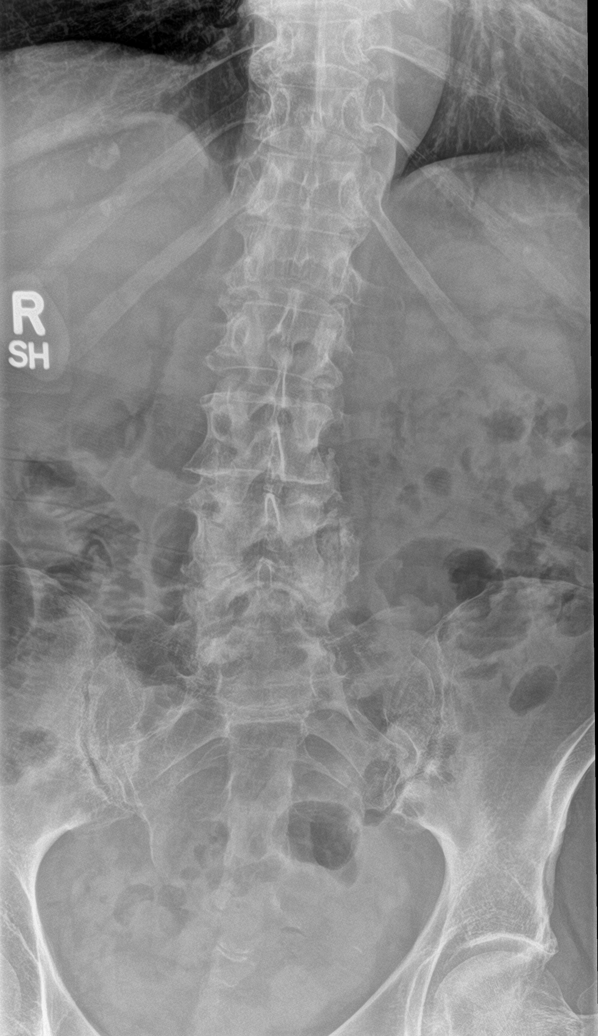

[l-spine obl (1 of 2)]
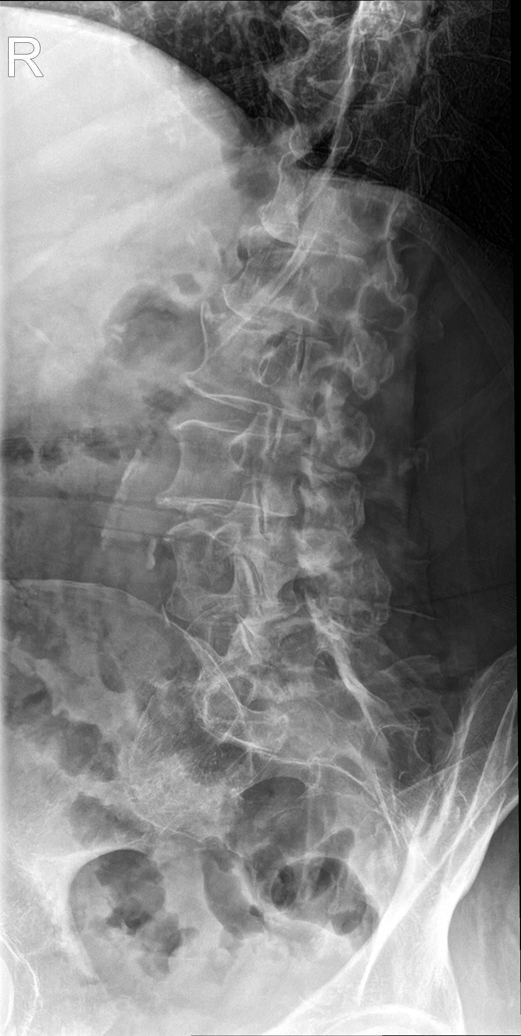

[l-spine obl (2 of 2)]
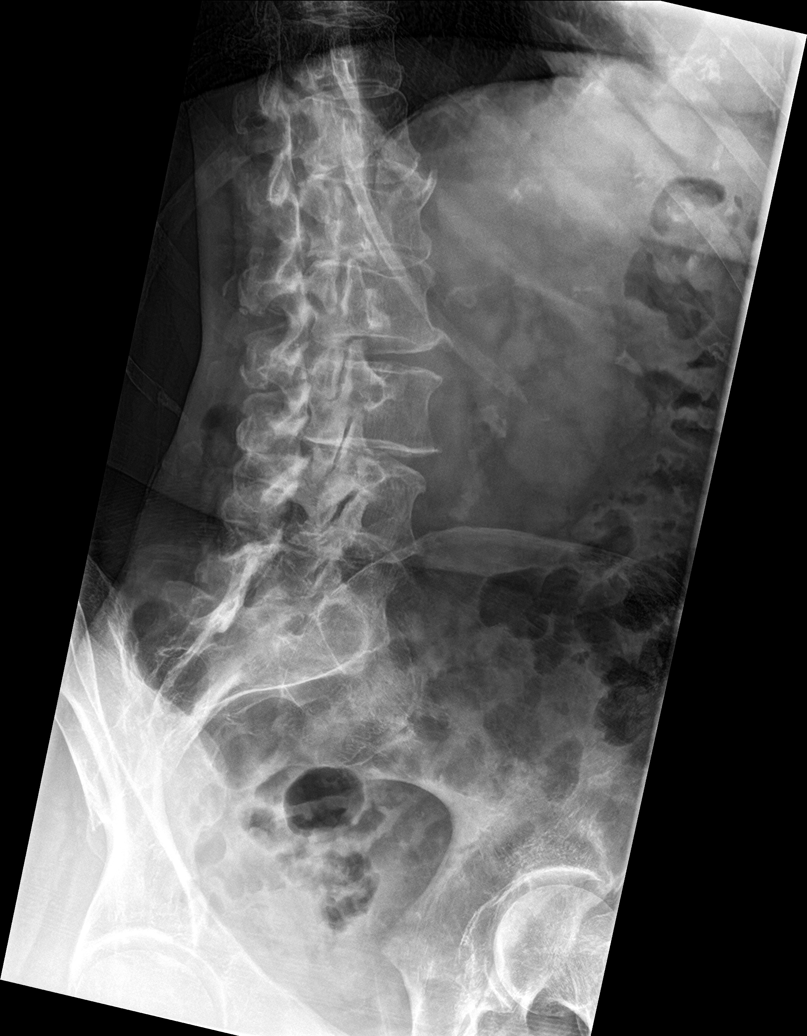

[l-spine lat]
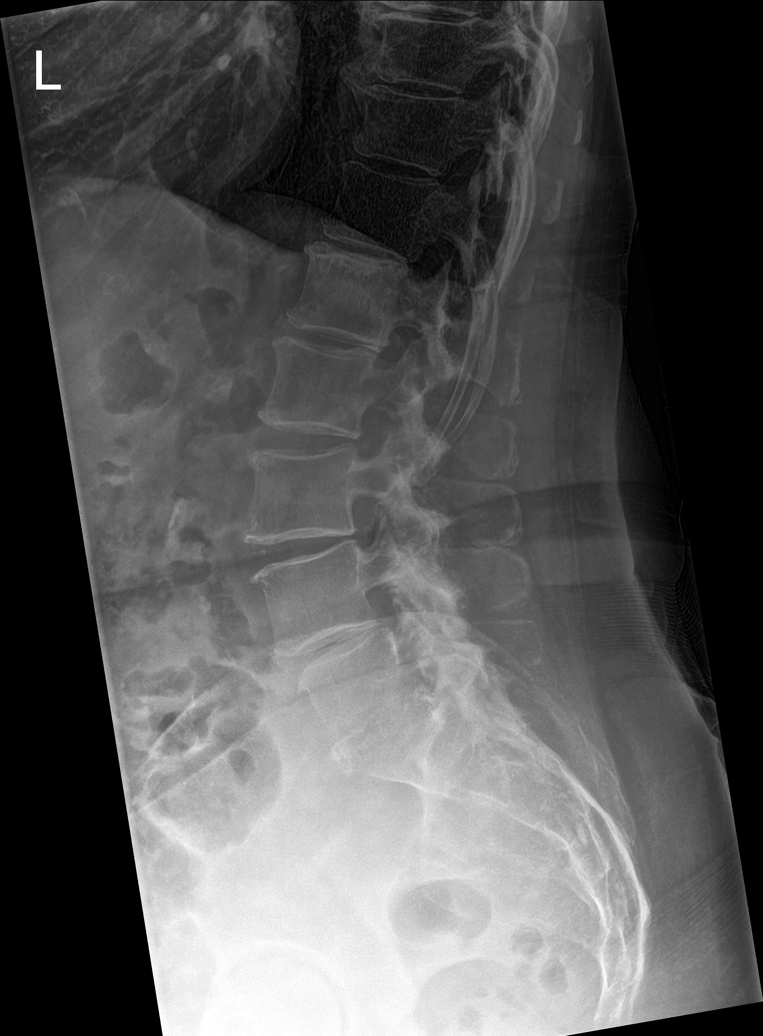

[l-spine spot]
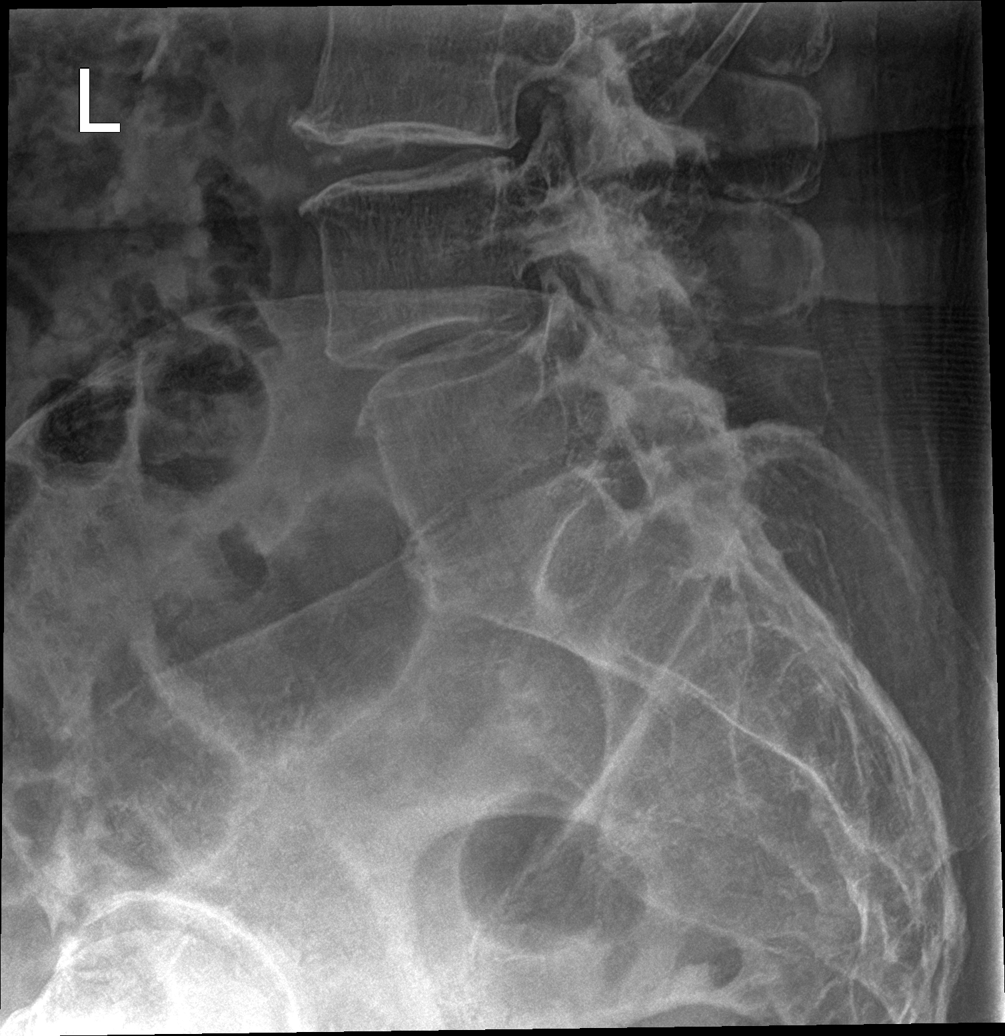

[5 of 5 positions shown; findings below may reference images not displayed]

FINDINGS: Thoracic spine:

There is mild S shaped curvature of the thoracic spine. There is no
evidence for fracture. Alignment is anatomic. There is mild disc
space narrowing and endplate osteophyte formation throughout the
thoracic spine compatible with degenerative change. There also
degenerative changes of the lower cervical spine. Soft tissues are
within normal limits.

Lumbar Spine:

Alignment is anatomic. There is no evidence for fracture. There is
disc space narrowing throughout the lumbar spine with endplate
osteophyte formation. This is most significant/severe at L5-S1,
similar to the prior study. There are atherosclerotic calcifications
of the aorta.
IMPRESSION: 1. No acute fracture or malalignment of the thoracic or lumbar
spine.
2. Degenerative changes of the thoracolumbar spine most significant
at L5-S1.

## 2023-07-25 DIAGNOSIS — I10 Essential (primary) hypertension: Secondary | ICD-10-CM | POA: Diagnosis not present

## 2023-08-27 DIAGNOSIS — M170 Bilateral primary osteoarthritis of knee: Secondary | ICD-10-CM | POA: Diagnosis not present

## 2023-08-27 DIAGNOSIS — I10 Essential (primary) hypertension: Secondary | ICD-10-CM | POA: Diagnosis not present

## 2023-08-27 DIAGNOSIS — F4323 Adjustment disorder with mixed anxiety and depressed mood: Secondary | ICD-10-CM | POA: Diagnosis not present

## 2023-08-27 DIAGNOSIS — F321 Major depressive disorder, single episode, moderate: Secondary | ICD-10-CM | POA: Diagnosis not present

## 2023-08-28 DIAGNOSIS — M170 Bilateral primary osteoarthritis of knee: Secondary | ICD-10-CM | POA: Diagnosis not present

## 2023-08-28 DIAGNOSIS — F419 Anxiety disorder, unspecified: Secondary | ICD-10-CM | POA: Diagnosis not present

## 2023-08-28 DIAGNOSIS — I10 Essential (primary) hypertension: Secondary | ICD-10-CM | POA: Diagnosis not present

## 2023-08-28 DIAGNOSIS — G629 Polyneuropathy, unspecified: Secondary | ICD-10-CM | POA: Diagnosis not present

## 2023-09-04 DIAGNOSIS — K649 Unspecified hemorrhoids: Secondary | ICD-10-CM | POA: Diagnosis not present

## 2023-09-04 DIAGNOSIS — K5900 Constipation, unspecified: Secondary | ICD-10-CM | POA: Diagnosis not present

## 2023-09-07 DIAGNOSIS — K649 Unspecified hemorrhoids: Secondary | ICD-10-CM | POA: Diagnosis not present

## 2023-09-07 DIAGNOSIS — K5900 Constipation, unspecified: Secondary | ICD-10-CM | POA: Diagnosis not present

## 2023-09-11 DIAGNOSIS — K642 Third degree hemorrhoids: Secondary | ICD-10-CM | POA: Diagnosis not present

## 2023-09-11 DIAGNOSIS — K644 Residual hemorrhoidal skin tags: Secondary | ICD-10-CM | POA: Diagnosis not present

## 2023-09-11 DIAGNOSIS — Z860100 Personal history of colon polyps, unspecified: Secondary | ICD-10-CM | POA: Diagnosis not present

## 2023-09-24 ENCOUNTER — Telehealth: Payer: Self-pay | Admitting: Diagnostic Neuroimaging

## 2023-09-24 NOTE — Telephone Encounter (Signed)
 Patient cancelled appointment due to possibly will have a family member in the hospital. Want to make sure will be able to help family member.

## 2023-09-27 DIAGNOSIS — I10 Essential (primary) hypertension: Secondary | ICD-10-CM | POA: Diagnosis not present

## 2023-09-27 DIAGNOSIS — F4323 Adjustment disorder with mixed anxiety and depressed mood: Secondary | ICD-10-CM | POA: Diagnosis not present

## 2023-09-27 DIAGNOSIS — F321 Major depressive disorder, single episode, moderate: Secondary | ICD-10-CM | POA: Diagnosis not present

## 2023-09-27 DIAGNOSIS — M170 Bilateral primary osteoarthritis of knee: Secondary | ICD-10-CM | POA: Diagnosis not present

## 2023-10-02 DIAGNOSIS — K642 Third degree hemorrhoids: Secondary | ICD-10-CM | POA: Diagnosis not present

## 2023-10-22 DIAGNOSIS — K5900 Constipation, unspecified: Secondary | ICD-10-CM | POA: Diagnosis not present

## 2023-10-22 DIAGNOSIS — K642 Third degree hemorrhoids: Secondary | ICD-10-CM | POA: Diagnosis not present

## 2023-10-28 DIAGNOSIS — F4323 Adjustment disorder with mixed anxiety and depressed mood: Secondary | ICD-10-CM | POA: Diagnosis not present

## 2023-10-28 DIAGNOSIS — I10 Essential (primary) hypertension: Secondary | ICD-10-CM | POA: Diagnosis not present

## 2023-10-28 DIAGNOSIS — F321 Major depressive disorder, single episode, moderate: Secondary | ICD-10-CM | POA: Diagnosis not present

## 2023-10-28 DIAGNOSIS — M170 Bilateral primary osteoarthritis of knee: Secondary | ICD-10-CM | POA: Diagnosis not present

## 2023-11-14 ENCOUNTER — Ambulatory Visit: Admitting: Diagnostic Neuroimaging

## 2023-11-27 DIAGNOSIS — I10 Essential (primary) hypertension: Secondary | ICD-10-CM | POA: Diagnosis not present

## 2023-11-27 DIAGNOSIS — F4323 Adjustment disorder with mixed anxiety and depressed mood: Secondary | ICD-10-CM | POA: Diagnosis not present

## 2023-11-27 DIAGNOSIS — M170 Bilateral primary osteoarthritis of knee: Secondary | ICD-10-CM | POA: Diagnosis not present

## 2023-11-27 DIAGNOSIS — F321 Major depressive disorder, single episode, moderate: Secondary | ICD-10-CM | POA: Diagnosis not present

## 2023-11-28 DIAGNOSIS — M1711 Unilateral primary osteoarthritis, right knee: Secondary | ICD-10-CM | POA: Diagnosis not present

## 2023-11-28 DIAGNOSIS — M1712 Unilateral primary osteoarthritis, left knee: Secondary | ICD-10-CM | POA: Diagnosis not present

## 2023-11-28 DIAGNOSIS — M170 Bilateral primary osteoarthritis of knee: Secondary | ICD-10-CM | POA: Diagnosis not present

## 2023-11-30 DIAGNOSIS — M1711 Unilateral primary osteoarthritis, right knee: Secondary | ICD-10-CM | POA: Diagnosis not present

## 2023-11-30 DIAGNOSIS — Z01818 Encounter for other preprocedural examination: Secondary | ICD-10-CM | POA: Diagnosis not present

## 2023-11-30 DIAGNOSIS — Z23 Encounter for immunization: Secondary | ICD-10-CM | POA: Diagnosis not present

## 2023-12-04 DIAGNOSIS — D3132 Benign neoplasm of left choroid: Secondary | ICD-10-CM | POA: Diagnosis not present

## 2023-12-07 DIAGNOSIS — I10 Essential (primary) hypertension: Secondary | ICD-10-CM | POA: Diagnosis not present

## 2023-12-07 DIAGNOSIS — H6991 Unspecified Eustachian tube disorder, right ear: Secondary | ICD-10-CM | POA: Diagnosis not present

## 2023-12-07 DIAGNOSIS — J302 Other seasonal allergic rhinitis: Secondary | ICD-10-CM | POA: Diagnosis not present

## 2023-12-13 DIAGNOSIS — M1712 Unilateral primary osteoarthritis, left knee: Secondary | ICD-10-CM | POA: Diagnosis not present

## 2023-12-25 DIAGNOSIS — M1711 Unilateral primary osteoarthritis, right knee: Secondary | ICD-10-CM | POA: Diagnosis not present

## 2023-12-26 DIAGNOSIS — M25761 Osteophyte, right knee: Secondary | ICD-10-CM | POA: Diagnosis not present

## 2023-12-26 DIAGNOSIS — M1711 Unilateral primary osteoarthritis, right knee: Secondary | ICD-10-CM | POA: Diagnosis not present

## 2023-12-26 DIAGNOSIS — G8918 Other acute postprocedural pain: Secondary | ICD-10-CM | POA: Diagnosis not present

## 2023-12-31 DIAGNOSIS — Z96651 Presence of right artificial knee joint: Secondary | ICD-10-CM | POA: Diagnosis not present

## 2023-12-31 DIAGNOSIS — M25661 Stiffness of right knee, not elsewhere classified: Secondary | ICD-10-CM | POA: Diagnosis not present

## 2024-01-02 ENCOUNTER — Ambulatory Visit (HOSPITAL_COMMUNITY)
Admission: RE | Admit: 2024-01-02 | Discharge: 2024-01-02 | Disposition: A | Discharge status: Home / Self Care | Source: Ambulatory Visit | Attending: Vascular Surgery | Admitting: Vascular Surgery

## 2024-01-02 ENCOUNTER — Other Ambulatory Visit (HOSPITAL_COMMUNITY): Payer: Self-pay | Admitting: Orthopedic Surgery

## 2024-01-02 DIAGNOSIS — R2241 Localized swelling, mass and lump, right lower limb: Secondary | ICD-10-CM

## 2024-01-03 DIAGNOSIS — Z96651 Presence of right artificial knee joint: Secondary | ICD-10-CM | POA: Diagnosis not present

## 2024-01-04 DIAGNOSIS — Z96651 Presence of right artificial knee joint: Secondary | ICD-10-CM | POA: Diagnosis not present

## 2024-01-04 DIAGNOSIS — M25661 Stiffness of right knee, not elsewhere classified: Secondary | ICD-10-CM | POA: Diagnosis not present

## 2024-01-09 DIAGNOSIS — M25661 Stiffness of right knee, not elsewhere classified: Secondary | ICD-10-CM | POA: Diagnosis not present

## 2024-01-09 DIAGNOSIS — Z96651 Presence of right artificial knee joint: Secondary | ICD-10-CM | POA: Diagnosis not present

## 2024-01-16 DIAGNOSIS — Z96651 Presence of right artificial knee joint: Secondary | ICD-10-CM | POA: Diagnosis not present

## 2024-01-16 DIAGNOSIS — M25661 Stiffness of right knee, not elsewhere classified: Secondary | ICD-10-CM | POA: Diagnosis not present

## 2024-01-21 DIAGNOSIS — M25661 Stiffness of right knee, not elsewhere classified: Secondary | ICD-10-CM | POA: Diagnosis not present

## 2024-01-21 DIAGNOSIS — Z96651 Presence of right artificial knee joint: Secondary | ICD-10-CM | POA: Diagnosis not present

## 2024-01-27 DIAGNOSIS — F321 Major depressive disorder, single episode, moderate: Secondary | ICD-10-CM | POA: Diagnosis not present

## 2024-01-27 DIAGNOSIS — M170 Bilateral primary osteoarthritis of knee: Secondary | ICD-10-CM | POA: Diagnosis not present

## 2024-01-27 DIAGNOSIS — F4323 Adjustment disorder with mixed anxiety and depressed mood: Secondary | ICD-10-CM | POA: Diagnosis not present

## 2024-01-27 DIAGNOSIS — I10 Essential (primary) hypertension: Secondary | ICD-10-CM | POA: Diagnosis not present

## 2024-01-31 DIAGNOSIS — Z96651 Presence of right artificial knee joint: Secondary | ICD-10-CM | POA: Diagnosis not present

## 2024-01-31 DIAGNOSIS — M25661 Stiffness of right knee, not elsewhere classified: Secondary | ICD-10-CM | POA: Diagnosis not present

## 2024-02-05 DIAGNOSIS — L282 Other prurigo: Secondary | ICD-10-CM | POA: Diagnosis not present

## 2024-02-11 DIAGNOSIS — Z96651 Presence of right artificial knee joint: Secondary | ICD-10-CM | POA: Diagnosis not present

## 2024-02-11 DIAGNOSIS — M25661 Stiffness of right knee, not elsewhere classified: Secondary | ICD-10-CM | POA: Diagnosis not present

## 2024-02-15 DIAGNOSIS — I10 Essential (primary) hypertension: Secondary | ICD-10-CM | POA: Diagnosis not present

## 2024-02-15 DIAGNOSIS — D7589 Other specified diseases of blood and blood-forming organs: Secondary | ICD-10-CM | POA: Diagnosis not present

## 2024-02-15 DIAGNOSIS — Z7185 Encounter for immunization safety counseling: Secondary | ICD-10-CM | POA: Diagnosis not present

## 2024-02-15 DIAGNOSIS — M170 Bilateral primary osteoarthritis of knee: Secondary | ICD-10-CM | POA: Diagnosis not present

## 2024-02-15 DIAGNOSIS — Z780 Asymptomatic menopausal state: Secondary | ICD-10-CM | POA: Diagnosis not present

## 2024-02-15 DIAGNOSIS — Z0000 Encounter for general adult medical examination without abnormal findings: Secondary | ICD-10-CM | POA: Diagnosis not present

## 2024-02-15 DIAGNOSIS — R7303 Prediabetes: Secondary | ICD-10-CM | POA: Diagnosis not present

## 2024-02-15 DIAGNOSIS — Z1211 Encounter for screening for malignant neoplasm of colon: Secondary | ICD-10-CM | POA: Diagnosis not present

## 2024-02-15 DIAGNOSIS — D649 Anemia, unspecified: Secondary | ICD-10-CM | POA: Diagnosis not present

## 2024-02-18 ENCOUNTER — Other Ambulatory Visit (HOSPITAL_BASED_OUTPATIENT_CLINIC_OR_DEPARTMENT_OTHER): Payer: Self-pay | Admitting: Family Medicine

## 2024-02-18 DIAGNOSIS — Z780 Asymptomatic menopausal state: Secondary | ICD-10-CM

## 2024-04-07 ENCOUNTER — Other Ambulatory Visit (HOSPITAL_BASED_OUTPATIENT_CLINIC_OR_DEPARTMENT_OTHER): Payer: Medicare (Managed Care)
